# Patient Record
Sex: Female | Born: 1985 | Hispanic: Yes | Smoking: Never smoker
Health system: Southern US, Community
[De-identification: ages and names within clinical notes are randomized; demographics above are authoritative.]

## PROBLEM LIST (undated history)

## (undated) DIAGNOSIS — I10 Essential (primary) hypertension: Secondary | ICD-10-CM

## (undated) DIAGNOSIS — E119 Type 2 diabetes mellitus without complications: Secondary | ICD-10-CM

## (undated) HISTORY — PX: NO PAST SURGERIES: SHX2092

## (undated) HISTORY — DX: Essential (primary) hypertension: I10

---
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
## 2006-03-12 ENCOUNTER — Inpatient Hospital Stay (HOSPITAL_COMMUNITY): Admission: AD | Admit: 2006-03-12 | Discharge: 2006-03-12 | Payer: Self-pay | Admitting: Obstetrics and Gynecology

## 2006-03-15 ENCOUNTER — Inpatient Hospital Stay (HOSPITAL_COMMUNITY): Admission: AD | Admit: 2006-03-15 | Discharge: 2006-03-15 | Payer: Self-pay | Admitting: *Deleted

## 2006-04-01 ENCOUNTER — Inpatient Hospital Stay (HOSPITAL_COMMUNITY): Admission: AD | Admit: 2006-04-01 | Discharge: 2006-04-04 | Payer: Self-pay | Admitting: Obstetrics and Gynecology

## 2006-06-16 ENCOUNTER — Ambulatory Visit: Payer: Self-pay | Admitting: Obstetrics and Gynecology

## 2006-06-16 ENCOUNTER — Inpatient Hospital Stay (HOSPITAL_COMMUNITY): Admission: AD | Admit: 2006-06-16 | Discharge: 2006-06-20 | Payer: Self-pay | Admitting: Family Medicine

## 2006-06-27 ENCOUNTER — Inpatient Hospital Stay (HOSPITAL_COMMUNITY): Admission: AD | Admit: 2006-06-27 | Discharge: 2006-06-27 | Payer: Self-pay | Admitting: Obstetrics and Gynecology

## 2006-06-27 ENCOUNTER — Ambulatory Visit: Payer: Self-pay | Admitting: Family Medicine

## 2006-07-11 ENCOUNTER — Ambulatory Visit: Payer: Self-pay | Admitting: Gynecology

## 2006-07-25 ENCOUNTER — Ambulatory Visit: Payer: Self-pay | Admitting: Family Medicine

## 2006-08-08 ENCOUNTER — Ambulatory Visit: Payer: Self-pay | Admitting: Gynecology

## 2006-08-29 ENCOUNTER — Ambulatory Visit: Payer: Self-pay | Admitting: Gynecology

## 2006-09-03 ENCOUNTER — Ambulatory Visit: Payer: Self-pay | Admitting: Obstetrics and Gynecology

## 2006-09-05 ENCOUNTER — Ambulatory Visit: Payer: Self-pay | Admitting: Gynecology

## 2006-09-09 ENCOUNTER — Ambulatory Visit: Payer: Self-pay | Admitting: Obstetrics and Gynecology

## 2006-09-09 ENCOUNTER — Inpatient Hospital Stay (HOSPITAL_COMMUNITY): Admission: AD | Admit: 2006-09-09 | Discharge: 2006-09-11 | Payer: Self-pay | Admitting: Obstetrics & Gynecology

## 2006-09-09 ENCOUNTER — Ambulatory Visit: Payer: Self-pay | Admitting: Obstetrics & Gynecology

## 2007-03-10 ENCOUNTER — Emergency Department (HOSPITAL_COMMUNITY): Admission: EM | Admit: 2007-03-10 | Discharge: 2007-03-10 | Payer: Self-pay | Admitting: Emergency Medicine

## 2014-10-11 ENCOUNTER — Other Ambulatory Visit (HOSPITAL_COMMUNITY): Payer: Self-pay | Admitting: Nurse Practitioner

## 2014-10-11 DIAGNOSIS — Z3689 Encounter for other specified antenatal screening: Secondary | ICD-10-CM

## 2014-10-11 LAB — GLUCOSE TOLERANCE, 1 HOUR (50G) W/O FASTING: GLUCOSE 1 HOUR GTT: 142 mg/dL (ref ?–200)

## 2014-10-11 LAB — OB RESULTS CONSOLE ANTIBODY SCREEN: Antibody Screen: NEGATIVE

## 2014-10-11 LAB — OB RESULTS CONSOLE HGB/HCT, BLOOD
HCT: 38 %
Hemoglobin: 12.5 g/dL

## 2014-10-11 LAB — OB RESULTS CONSOLE ABO/RH: RH TYPE: NEGATIVE

## 2014-10-11 LAB — OB RESULTS CONSOLE PLATELET COUNT: Platelets: 227 10*3/uL

## 2014-10-11 LAB — OB RESULTS CONSOLE GC/CHLAMYDIA
Chlamydia: NEGATIVE
GC PROBE AMP, GENITAL: NEGATIVE

## 2014-10-11 LAB — OB RESULTS CONSOLE HIV ANTIBODY (ROUTINE TESTING): HIV: NONREACTIVE

## 2014-10-11 LAB — OB RESULTS CONSOLE VARICELLA ZOSTER ANTIBODY, IGG
Varicella: IMMUNE
Varicella: IMMUNE

## 2014-10-11 LAB — CYSTIC FIBROSIS DIAGNOSTIC STUDY: Interpretation-CFDNA:: NEGATIVE

## 2014-10-11 LAB — CYTOLOGY - PAP: CYTOLOGY - PAP: NEGATIVE

## 2014-10-11 LAB — OB RESULTS CONSOLE RPR: RPR: NONREACTIVE

## 2014-10-11 LAB — CULTURE, OB URINE: URINE CULTURE, OB: NO GROWTH

## 2014-10-12 LAB — OB RESULTS CONSOLE HEPATITIS B SURFACE ANTIGEN: Hepatitis B Surface Ag: NEGATIVE

## 2014-10-12 LAB — OB RESULTS CONSOLE HIV ANTIBODY (ROUTINE TESTING): HIV: NONREACTIVE

## 2014-10-12 LAB — OB RESULTS CONSOLE RUBELLA ANTIBODY, IGM: Rubella: IMMUNE

## 2014-10-12 LAB — SICKLE CELL SCREEN: SICKLE CELL SCREEN: NORMAL

## 2014-10-18 ENCOUNTER — Encounter: Payer: Self-pay | Attending: Obstetrics & Gynecology | Admitting: *Deleted

## 2014-10-18 ENCOUNTER — Ambulatory Visit: Payer: Self-pay | Admitting: Obstetrics & Gynecology

## 2014-10-18 VITALS — Ht 59.0 in | Wt 146.0 lb

## 2014-10-18 DIAGNOSIS — O24419 Gestational diabetes mellitus in pregnancy, unspecified control: Secondary | ICD-10-CM

## 2014-10-18 DIAGNOSIS — Z713 Dietary counseling and surveillance: Secondary | ICD-10-CM | POA: Insufficient documentation

## 2014-10-18 LAB — POCT URINALYSIS DIP (DEVICE)
Bilirubin Urine: NEGATIVE
Glucose, UA: NEGATIVE mg/dL
HGB URINE DIPSTICK: NEGATIVE
KETONES UR: NEGATIVE mg/dL
Nitrite: NEGATIVE
PH: 7 (ref 5.0–8.0)
Protein, ur: NEGATIVE mg/dL
SPECIFIC GRAVITY, URINE: 1.015 (ref 1.005–1.030)
UROBILINOGEN UA: 0.2 mg/dL (ref 0.0–1.0)

## 2014-10-18 NOTE — Progress Notes (Signed)
  Patient was seen on 10/18/14 for Gestational Diabetes self-management . The following learning objectives were met by the patient :   States the definition of Gestational Diabetes  States when to check blood glucose levels  Demonstrates proper blood glucose monitoring techniques  States the effect of stress and exercise on blood glucose levels  States the importance of limiting caffeine and abstaining from alcohol and smoking  Plan:  Consider  increasing your activity level by walking daily as tolerated Begin checking BG before breakfast and 1-2 hours after first bit of breakfast, lunch and dinner after  as directed by MD  Take medication  as directed by MD  Blood glucose monitor given: [employer] Lot # [id number] Exp: 2016/09/19 Blood glucose reading: 130m/dl 3hpp  Patient instructed to monitor glucose levels: FBS: 60 - <90 2 hour: <120  Patient received the following handouts:  Nutrition Diabetes and Pregnancy  Patient will be seen for follow-up as needed.

## 2014-10-18 NOTE — Progress Notes (Signed)
Nutrition note: GDM diet education only Pt is a newly diagnosed GDM pt Pt has gained 3# @ [id number] (pt due 04/03/15 per pt), which is < expected so far. Pt reports eating 3 meals & 2 snacks/d. Pt is taking a PNV. Pt reports having N/V often and heartburn occ. NKFA. Pt reports no physical activity currently. Pt received verbal & written education in Spanish via interpreter about GDM diet. Discussed tips to decrease N/V. Encouraged walking most days. Discussed wt gain goals of 15-25# or 0.6#/wk. Pt agrees to continue taking PNV & follow GDM diet with 3 meals & 2-3 snacks/d with proper CHO/ protein combination. Pt has WIC & plans to BF. F/u in 4-6 wks Blondell RevealLaura Garlene Apperson, MS, RD, LDN, [facility]

## 2014-10-19 DIAGNOSIS — O09299 Supervision of pregnancy with other poor reproductive or obstetric history, unspecified trimester: Secondary | ICD-10-CM | POA: Insufficient documentation

## 2014-10-19 DIAGNOSIS — O26899 Other specified pregnancy related conditions, unspecified trimester: Secondary | ICD-10-CM

## 2014-10-19 DIAGNOSIS — O161 Unspecified maternal hypertension, first trimester: Secondary | ICD-10-CM

## 2014-10-19 DIAGNOSIS — O24419 Gestational diabetes mellitus in pregnancy, unspecified control: Secondary | ICD-10-CM

## 2014-10-19 DIAGNOSIS — Z6741 Type O blood, Rh negative: Secondary | ICD-10-CM

## 2014-10-19 DIAGNOSIS — Z758 Other problems related to medical facilities and other health care: Secondary | ICD-10-CM

## 2014-10-19 DIAGNOSIS — Z789 Other specified health status: Secondary | ICD-10-CM

## 2014-10-19 DIAGNOSIS — Z6791 Unspecified blood type, Rh negative: Secondary | ICD-10-CM | POA: Insufficient documentation

## 2014-10-20 ENCOUNTER — Encounter: Payer: Self-pay | Admitting: *Deleted

## 2014-10-25 ENCOUNTER — Encounter: Payer: Self-pay | Admitting: Family Medicine

## 2014-10-25 ENCOUNTER — Ambulatory Visit (INDEPENDENT_AMBULATORY_CARE_PROVIDER_SITE_OTHER): Payer: Self-pay | Admitting: Family Medicine

## 2014-10-25 VITALS — BP 124/63 | HR 96 | Temp 98.5°F | Wt 145.2 lb

## 2014-10-25 DIAGNOSIS — Z8759 Personal history of other complications of pregnancy, childbirth and the puerperium: Secondary | ICD-10-CM

## 2014-10-25 DIAGNOSIS — O24419 Gestational diabetes mellitus in pregnancy, unspecified control: Secondary | ICD-10-CM

## 2014-10-25 DIAGNOSIS — O09292 Supervision of pregnancy with other poor reproductive or obstetric history, second trimester: Secondary | ICD-10-CM

## 2014-10-25 DIAGNOSIS — O0992 Supervision of high risk pregnancy, unspecified, second trimester: Secondary | ICD-10-CM

## 2014-10-25 DIAGNOSIS — O09219 Supervision of pregnancy with history of pre-term labor, unspecified trimester: Secondary | ICD-10-CM | POA: Insufficient documentation

## 2014-10-25 DIAGNOSIS — O0993 Supervision of high risk pregnancy, unspecified, third trimester: Secondary | ICD-10-CM | POA: Insufficient documentation

## 2014-10-25 LAB — POCT URINALYSIS DIP (DEVICE)
BILIRUBIN URINE: NEGATIVE
GLUCOSE, UA: NEGATIVE mg/dL
HGB URINE DIPSTICK: NEGATIVE
Ketones, ur: NEGATIVE mg/dL
Nitrite: NEGATIVE
Protein, ur: 30 mg/dL — AB
SPECIFIC GRAVITY, URINE: 1.02 (ref 1.005–1.030)
Urobilinogen, UA: 0.2 mg/dL (ref 0.0–1.0)
pH: 7 (ref 5.0–8.0)

## 2014-10-25 MED ORDER — GLYBURIDE 5 MG PO TABS: 5.0000 mg | ORAL_TABLET | Freq: Every day | ORAL | Status: DC

## 2014-10-25 NOTE — Progress Notes (Signed)
Patient transferred here from [facility] due to failing an early 3 hr GTT.  Met with nutrition and diabetic nurse last week and has been checking her blood sugars.  Fasting - 114-125 PP - 9/21 elevated Start glyburide 35m in PM Has history of gestational hypertension with other pregnancies, never on medications. Counseled regarding importance of checking blood sugars and reporting accurate numbers. F/u in 2 weeks.

## 2014-10-25 NOTE — Patient Instructions (Signed)
Diabetes mellitus gestacional (Gestational Diabetes Mellitus) La diabetes mellitus gestacional, ms comnmente conocida como diabetes gestacional es un tipo de diabetes que desarrollan algunas mujeres durante el embarazo. En la diabetes gestacional, el pncreas no produce suficiente insulina (una hormona) o las clulas son menos sensibles a la insulina producida (resistencia a la insulina), o ambas cosas. Normalmente, la insulina mueve los azcares de los alimentos a las clulas de los tejidos. Las clulas de los tejidos utilizan los azcares para obtener energa. La falta de insulina o la falta de una respuesta normal a la insulina hace que el exceso de azcar se acumule en la sangre en lugar de penetrar en las clulas de los tejidos. Como resultado, se producen niveles altos de azcar en la sangre (hiperglucemia). El efecto de los niveles altos de azcar (glucosa) puede causar muchos problemas.  FACTORES DE RIESGO Usted tiene mayor probabilidad de desarrollar diabetes gestacional si tiene antecedentes familiares de diabetes y tambin si tiene uno o ms de los siguientes factores de riesgo:  ndice de masa corporal superior a 30 (obesidad).  Embarazo previo con diabetes gestacional.  La edad avanzada en el momento del embarazo. Si se mantienen los niveles de glucosa en la sangre en un rango normal durante el embarazo, las mujeres pueden tener un embarazo saludable. Si los niveles de glucosa en la sangre no estn bien controlados, puede haber riesgos para usted, el feto o el recin nacido, o durante el trabajo de parto y el parto.  SNTOMAS  Si se presentan sntomas, stos son similares a los sntomas que normalmente experimentar durante el embarazo. Los sntomas de la diabetes gestacional son:   Aumento de la sed (polidipsia).  Aumento de la miccin (poliuria).  Orina con ms frecuencia durante la noche (nocturia).  Prdida de peso. La prdida de peso puede ser muy rpida.  Infecciones  frecuentes y recurrentes.  Cansancio (fatiga).  Debilidad.  Cambios en la visin, como visin borrosa.  Olor a fruta en el aliento.  Dolor abdominal. DIAGNSTICO La diabetes se diagnostica cuando hay aumento de los niveles de glucosa en la sangre. El nivel de glucosa en la sangre puede controlarse en uno o ms de los siguientes anlisis de sangre:  Medicin de glucosa en la sangre en ayunas. No se le permitir comer durante al menos 8 horas antes de que se tome una muestra de sangre.  Pruebas al azar de glucosa en la sangre. El nivel de glucosa en la sangre se controla en cualquier momento del da sin importar el momento en que haya comido.  Prueba de A1c (hemoglobina glucosilada) Una prueba de A1c proporciona informacin sobre el control de la glucosa en la sangre durante los ltimos 3 meses.  Prueba de tolerancia a la glucosa oral (PTGO). La glucosa en la sangre se mide despus de no haber comido (ayunas) durante una a tres horas y despus de beber una bebida que contenga glucosa. Dado que las hormonas que causan la resistencia a la insulina son ms altas alrededor de las semanas 24 a 28 de embarazo, generalmente se realiza una PTGO durante ese tiempo. Si tiene factores de riesgo de diabetes gestacional, su mdico puede hacerle estudios de deteccin antes de las 24semanas de embarazo. TRATAMIENTO   Usted tendr que tomar medicamentos para la diabetes o insulina diariamente para mantener los niveles de glucosa en la sangre en el rango deseado.  Usted tendr que combinar la dosis de insulina con la actividad fsica y la eleccin de alimentos saludables. El objetivo del   tratamiento es mantener el nivel de azcar en la sangre previo a comer (preprandial) y durante la noche entre 60 y 99mg/dl, durante todo el embarazo. El objetivo del tratamiento es mantener el nivel pico de azcar en la sangre despus de comer (glucosa posprandial) entre 100y 140mg/dl. INSTRUCCIONES PARA EL CUIDADO EN EL  HOGAR   Controle su nivel de hemoglobina A1c dos veces al ao.  Contrlese a diario el nivel de glucosa en la sangre segn las indicaciones de su mdico. Es comn realizar controles frecuentes de la glucosa en la sangre.  Supervise las cetonas en la orina cuando est enferma y segn las indicaciones de su mdico.  Tome el medicamento para la diabetes y adminstrese insulina segn las indicaciones de su mdico para mantener el nivel de glucosa en la sangre en el rango deseado.  Nunca se quede sin medicamento para la diabetes o sin insulina. Es necesario que la reciba todos los das.  Ajuste la insulina segn la ingesta de hidratos de carbono. Los hidratos de carbono pueden aumentar los niveles de glucosa en la sangre, pero deben incluirse en su dieta. Los hidratos de carbono aportan vitaminas, minerales y fibra que son una parte esencial de una dieta saludable. Los hidratos de carbono se encuentran en frutas, verduras, cereales integrales, productos lcteos, legumbres y alimentos que contienen azcares aadidos.  Consuma alimentos saludables. Alterne 3 comidas con 3 colaciones.  Aumente de peso saludablemente. El aumento del peso total vara de acuerdo con el ndice de masa corporal que tena antes del embarazo ([facility]).  Lleve una tarjeta de alerta mdica o use una pulsera o medalla de alerta mdica.  Lleve con usted una colacin de 15gramos de hidratos de carbono en todo momento para controlar los niveles bajos de glucosa en la sangre (hipoglucemia). Algunos ejemplos de colaciones de 15gramos de hidratos de carbono son los siguientes:  Tabletas de glucosa, 3 o 4.  Gel de glucosa, tubo de 15 gramos.  Pasas de uva, 2 cucharadas (24 g).  Caramelos de goma, 6.  Galletas de animales, 8.  Jugo de fruta, gaseosa comn, o leche descremada, 4 onzas (120 ml).  Pastillas de goma, 9.  Reconocer la hipoglucemia. Durante el embarazo la hipoglucemia se produce cuando hay niveles de glucosa en la  sangre de 60 mg/dl o menos. El riesgo de hipoglucemia aumenta durante el ayuno o cuando se saltea las comidas, durante o despus de realizar ejercicio intenso y mientras duerme. Los sntomas de hipoglucemia son:  Temblores o sacudidas.  Disminucin de la capacidad de concentracin.  Sudoracin.  Aumento de la frecuencia cardaca.  Dolor de cabeza.  Sequedad en la boca.  Hambre.  Irritabilidad.  Ansiedad.  Sueo agitado.  Alteracin del habla o de la coordinacin.  Confusin.  Tratar la hipoglucemia rpidamente. Si usted est alerta y puede tragar con seguridad, siga la regla de 15/15 que consiste en:  Tome entre 15 y 20gramos de glucosa de accin rpida o carbohidratos. Las opciones de accin rpida son un gel de glucosa, tabletas de glucosa, o 4 onzas (120 ml) de jugo de frutas, gaseosa comn, o leche baja en grasa.  Compruebe su nivel de glucosa en la sangre 15 minutos despus de tomar la glucosa.  Tome entre 15 y 20 gramos ms de glucosa si el nivel de glucosa en la sangre todava es de 70mg/dl o inferior.  Ingiera una comida o una colacin en el lapso de 1 hora una vez que los niveles de glucosa en la sangre vuelven   a la normalidad.  Est atento a la poliuria (miccin excesiva) y la polidipsia (sensacin de mucha sed), que son los primeros signos de la hiperglucemia. El reconocimiento temprano de la hiperglucemia permite un tratamiento oportuno. Trate la hiperglucemia segn le indic su mdico.  Haga actividad fsica por lo menos 30minutos al da o como lo indique su mdico. Se recomienda que 30 minutos despus de cada comida, realice diez minutos de actividad fsica para controlar los niveles de glucosa postprandial en la sangre.  Ajuste su dosis de insulina y la ingesta de alimentos, segn sea necesario, si inicia un nuevo ejercicio o deporte.  Siga su plan para los das de enfermedad cuando no pueda comer o beber como de costumbre.  Evite el tabaco y el  alcohol.  Concurra a todas las visitas de control como se lo haya indicado el mdico.  Siga el consejo del mdico respecto a los controles prenatales y posteriores al parto (postparto), las visitas, la planificacin de las comidas, el ejercicio, los medicamentos, las vitaminas, los anlisis de sangre, otras pruebas mdicas y actividades fsicas.  Realice diariamente el cuidado de la piel y de los pies. Examine su piel y los pies diariamente para ver si tiene cortes, moretones, enrojecimiento, problemas en las uas, sangrado, ampollas o llagas.  Cepllese los dientes y encas por lo menos dos veces al da y use hilo dental al menos una vez por da. Concurra regularmente a las visitas de control con el dentista.  Programe un examen de vista durante el primer trimestre de su embarazo o como lo indique su mdico.  Comparta su plan de control de diabetes en el trabajo o en la escuela.  Mantngase al da con las vacunas.  Aprenda a manejar el estrs.  Obtenga la mayor cantidad posible de informacin sobre la diabetes y solicite ayuda siempre que sea necesario.  Obtenga informacin sobre el amamantamiento y analice esta posibilidad.  Debe controlar el nivel de azcar en la sangre de 6a 12semanas despus del parto. Esto se hace con una prueba de tolerancia a la glucosa oral (PTGO). SOLICITE ATENCIN MDICA SI:   No puede comer alimentos o beber por ms de 6 horas.  Tuvo nuseas o ha vomitado durante ms de 6 horas.  Tiene un nivel de glucosa en la sangre de 200 mg/dl y cetonas en la orina.  Presenta algn cambio en el estado mental.  Desarrolla problemas de visin.  Sufre un dolor persistente de cabeza.  Siente dolor o molestias en la parte superior del abdomen.  Desarrolla una enfermedad grave adicional.  Tuvo diarrea durante ms de 6 horas.  Ha estado enfermo o ha tenido fiebre durante un par de das y no mejora. SOLICITE ATENCIN MDICA DE INMEDIATO SI:   Tiene dificultad  para respirar.  Ya no siente los movimientos del beb.  Est sangrando o tiene flujo vaginal.  Comienza a tener contracciones o trabajo de parto prematuro. ASEGRESE DE QUE:  Comprende estas instrucciones.  Controlar su afeccin.  Recibir ayuda de inmediato si no mejora o si empeora. Document Released: 09/26/2005 Document Revised: 05/03/2014 [employer] Patient Information 2015 [employer]. This information is not intended to replace advice given to you by your health care provider. Make sure you discuss any questions you have with your health care provider.  

## 2014-11-01 ENCOUNTER — Encounter: Payer: Self-pay | Admitting: Family Medicine

## 2014-11-08 ENCOUNTER — Encounter: Payer: Self-pay | Admitting: Obstetrics & Gynecology

## 2014-11-08 ENCOUNTER — Ambulatory Visit (INDEPENDENT_AMBULATORY_CARE_PROVIDER_SITE_OTHER): Payer: Self-pay | Admitting: Obstetrics & Gynecology

## 2014-11-08 ENCOUNTER — Encounter: Payer: Self-pay | Attending: Obstetrics & Gynecology | Admitting: *Deleted

## 2014-11-08 VITALS — BP 103/54 | HR 98 | Temp 98.5°F | Wt 144.9 lb

## 2014-11-08 DIAGNOSIS — O0992 Supervision of high risk pregnancy, unspecified, second trimester: Secondary | ICD-10-CM

## 2014-11-08 DIAGNOSIS — Z713 Dietary counseling and surveillance: Secondary | ICD-10-CM | POA: Insufficient documentation

## 2014-11-08 DIAGNOSIS — R8290 Unspecified abnormal findings in urine: Secondary | ICD-10-CM

## 2014-11-08 DIAGNOSIS — O161 Unspecified maternal hypertension, first trimester: Secondary | ICD-10-CM

## 2014-11-08 DIAGNOSIS — O24419 Gestational diabetes mellitus in pregnancy, unspecified control: Secondary | ICD-10-CM | POA: Insufficient documentation

## 2014-11-08 DIAGNOSIS — O09292 Supervision of pregnancy with other poor reproductive or obstetric history, second trimester: Secondary | ICD-10-CM

## 2014-11-08 DIAGNOSIS — O131 Gestational [pregnancy-induced] hypertension without significant proteinuria, first trimester: Secondary | ICD-10-CM

## 2014-11-08 LAB — POCT URINALYSIS DIP (DEVICE)
GLUCOSE, UA: NEGATIVE mg/dL
HGB URINE DIPSTICK: NEGATIVE
Ketones, ur: NEGATIVE mg/dL
NITRITE: NEGATIVE
Protein, ur: 30 mg/dL — AB
SPECIFIC GRAVITY, URINE: 1.025 (ref 1.005–1.030)
Urobilinogen, UA: 0.2 mg/dL (ref 0.0–1.0)
pH: 6 (ref 5.0–8.0)

## 2014-11-08 LAB — CBC
HEMATOCRIT: 35.7 % — AB (ref 36.0–46.0)
Hemoglobin: 12.2 g/dL (ref 12.0–15.0)
MCH: 29 pg (ref 26.0–34.0)
MCHC: 34.2 g/dL (ref 30.0–36.0)
MCV: 84.8 fL (ref 78.0–100.0)
Platelets: 238 10*3/uL (ref 150–400)
RBC: 4.21 MIL/uL (ref 3.87–5.11)
RDW: 13.9 % (ref 11.5–15.5)
WBC: 11.4 10*3/uL — AB (ref 4.0–10.5)

## 2014-11-08 MED ORDER — ASPIRIN EC 81 MG PO TBEC: 81.0000 mg | DELAYED_RELEASE_TABLET | Freq: Every day | ORAL | Status: DC

## 2014-11-08 NOTE — Progress Notes (Signed)
DIABETIC: Review of dietary eating schedule. Always have protein with carbs. Always have HS snack to include carbs & Protein. FBS readings 89-108 as she is getting up during the night to eat. Patient verbalized understanding through assistance of Spanish interpreter.

## 2014-11-08 NOTE — Progress Notes (Signed)
Fetal Echo with Dr. Elizebeth Brookingotton 12/07/14 @ 830a; pt aware $100 due at time of appt.  [facility] [phone]), pt aware $75 due at time of appt.Spanish interpreter Maretta LosBlanca Lindner present.

## 2014-11-08 NOTE — Patient Instructions (Signed)
Regrese a la clinica cuando tenga su cita. Si tiene problemas o preguntas, llama a la clinica o vaya a la sala de emergencia al [facility].    

## 2014-11-08 NOTE — Progress Notes (Signed)
Patient is Spanish-speaking only, Spanish interpreter present for this encounter. Patient was started on Glyburide 5 mg po qhs last visit. Since then, she reports multiple episodes of hypoglycemia in the 50s, has to eat a couple of hours before she checks fasting values. As such, fastings 79-108 (seven values above 100).  Postprandials are mostly within range, abnormal lunch values of 139, 140 and dinner 153, 185, 160. Will reduce dosage to Glyburide 2.5 mg po qhs.  Instructed on how to eat meals and snacks, emphasized importance of bedtime snack. Patient to meet with DM educator today.  WIll reevaluate next week.  If still very hypoglycemic, may consider another medication such as Metformin 500 mg po bid and titrate up as needed.   Urine dip showed 30 protein, moderate LE, culture sent. No urinary symptoms. Anatomy scan scheduled for next week.  Quad screen to be checked today in addition to baseline A2/B DM labs.  Fetal ECHO and Optho exam ordered.  ASA 81 mg po daily ordered, also given history of preeclampsia in previous pregnancies. No other complaints or concerns.  Preeclampsia, routine obstetric precautions reviewed.

## 2014-11-09 LAB — AFP, QUAD SCREEN
AFP: 63.8 ng/mL
Curr Gest Age: 19.1 wks.days
Down Syndrome Scr Risk Est: 1:38500 {titer}
HCG TOTAL: 29.82 [IU]/mL
INH: 113.6 pg/mL
INTERPRETATION-AFP: NEGATIVE
MoM for AFP: 1.21
MoM for INH: 0.62
MoM for hCG: 1.21
OPEN SPINA BIFIDA: NEGATIVE
Tri 18 Scr Risk Est: NEGATIVE
Trisomy 18 (Edward) Syndrome Interp.: <1:77400 {titer}
UE3 VALUE: 3.19 ng/mL
uE3 Mom: 2.06

## 2014-11-09 LAB — HEMOGLOBIN A1C
HEMOGLOBIN A1C: 5.1 % (ref <5.7)
Mean Plasma Glucose: 100 mg/dL (ref <117)

## 2014-11-09 LAB — COMPREHENSIVE METABOLIC PANEL
ALT: 10 U/L (ref 0–35)
AST: 19 U/L (ref 0–37)
Albumin: 3.8 g/dL (ref 3.5–5.2)
Alkaline Phosphatase: 55 U/L (ref 39–117)
BILIRUBIN TOTAL: 0.3 mg/dL (ref 0.2–1.2)
BUN: 7 mg/dL (ref 6–23)
CHLORIDE: 103 meq/L (ref 96–112)
CO2: 24 meq/L (ref 19–32)
CREATININE: 0.43 mg/dL — AB (ref 0.50–1.10)
Calcium: 9.3 mg/dL (ref 8.4–10.5)
GLUCOSE: 80 mg/dL (ref 70–99)
Potassium: 4 mEq/L (ref 3.5–5.3)
Sodium: 135 mEq/L (ref 135–145)
TOTAL PROTEIN: 6.5 g/dL (ref 6.0–8.3)

## 2014-11-09 LAB — PROTEIN / CREATININE RATIO, URINE
Creatinine, Urine: 244.1 mg/dL
Protein Creatinine Ratio: 0.12 (ref <0.15)
TOTAL PROTEIN, URINE: 30 mg/dL — AB (ref 5–24)

## 2014-11-09 LAB — TSH: TSH: 2.026 u[IU]/mL (ref 0.350–4.500)

## 2014-11-10 LAB — CULTURE, OB URINE

## 2014-11-15 ENCOUNTER — Ambulatory Visit (HOSPITAL_COMMUNITY)
Admission: RE | Admit: 2014-11-15 | Discharge: 2014-11-15 | Disposition: A | Discharge status: Home / Self Care | Payer: Self-pay | Source: Ambulatory Visit | Attending: Nurse Practitioner | Admitting: Nurse Practitioner

## 2014-11-15 ENCOUNTER — Ambulatory Visit (INDEPENDENT_AMBULATORY_CARE_PROVIDER_SITE_OTHER): Payer: Self-pay | Admitting: Family Medicine

## 2014-11-15 VITALS — BP 117/64 | HR 94 | Temp 98.5°F | Wt 144.8 lb

## 2014-11-15 DIAGNOSIS — Z36 Encounter for antenatal screening of mother: Secondary | ICD-10-CM | POA: Insufficient documentation

## 2014-11-15 DIAGNOSIS — O24112 Pre-existing diabetes mellitus, type 2, in pregnancy, second trimester: Secondary | ICD-10-CM

## 2014-11-15 DIAGNOSIS — Z3689 Encounter for other specified antenatal screening: Secondary | ICD-10-CM | POA: Insufficient documentation

## 2014-11-15 DIAGNOSIS — Z3A20 20 weeks gestation of pregnancy: Secondary | ICD-10-CM | POA: Insufficient documentation

## 2014-11-15 LAB — POCT URINALYSIS DIP (DEVICE)
Bilirubin Urine: NEGATIVE
Glucose, UA: NEGATIVE mg/dL
Hgb urine dipstick: NEGATIVE
Ketones, ur: NEGATIVE mg/dL
NITRITE: NEGATIVE
PROTEIN: NEGATIVE mg/dL
SPECIFIC GRAVITY, URINE: 1.025 (ref 1.005–1.030)
UROBILINOGEN UA: 0.2 mg/dL (ref 0.0–1.0)
pH: 6.5 (ref 5.0–8.0)

## 2014-11-15 NOTE — Progress Notes (Signed)
Pt states compliance with glyburide 2.5 mg PO qhs. Dec from 5 to 2.5 last visit because she felt bad and shaky Fasting - 108, 111, 106, 107, 99, 93, 98 (6 of 7 above 95) 2 hr after - 90 - 120 (all controlled)  Given Am above goal, increased to 3.75 mg q PM.  No other complaints. Anatomy scan today

## 2014-11-15 NOTE — Progress Notes (Signed)
Patient reports occasional pelvic pressure; carol present for interprete

## 2014-11-29 ENCOUNTER — Ambulatory Visit (INDEPENDENT_AMBULATORY_CARE_PROVIDER_SITE_OTHER): Payer: Self-pay | Admitting: Family Medicine

## 2014-11-29 VITALS — BP 109/62 | HR 96 | Temp 98.0°F | Wt 145.0 lb

## 2014-11-29 DIAGNOSIS — O24419 Gestational diabetes mellitus in pregnancy, unspecified control: Secondary | ICD-10-CM

## 2014-11-29 NOTE — Progress Notes (Signed)
Patient reports pelvic pressure.

## 2014-11-29 NOTE — Progress Notes (Signed)
Growth U/S with Radiology 12/13/14 @ 4p.  Spanish interpreter Pearletha AlfredMaria Elena Jimenez present.

## 2014-11-29 NOTE — Progress Notes (Signed)
Fasting - 3 out of 7 within range.  2hr PP - 3 out of range.  Will leave Glyburide the same as had hypoglycemia when increased to full tablet.  If other blood sugars increase, can add metformin or in glyburide. No contractions, good fetal movement. Needs 24 hour urine.  Had optho appt.  Has appt for fetal echo.  Schedule f/u US for growth in 2 weeks.

## 2014-11-29 NOTE — Patient Instructions (Signed)
Segundo trimestre de embarazo (Second Trimester of Pregnancy) El segundo trimestre va desde la semana13 hasta la 28, desde el cuarto hasta el sexto mes, y suele ser el momento en el que mejor se siente. Su organismo se ha adaptado a estar embarazada y comienza a sentirse fsicamente mejor. En general, las nuseas matutinas han disminuido o han desaparecido completamente, p El segundo trimestre es tambin la poca en la que el feto se desarrolla rpidamente. Hacia el final del sexto mes, el feto mide aproximadamente 9pulgadas (23cm) y pesa alrededor de 1 libras (700g). Es probable que sienta que el beb se mueve (da pataditas) entre las 18 y 20semanas del embarazo. CAMBIOS EN EL ORGANISMO Su organismo atraviesa por muchos cambios durante el embarazo, y estos varan de una mujer a otra.   Seguir aumentando de peso. Notar que la parte baja del abdomen sobresale.  Podrn aparecer las primeras estras en las caderas, el abdomen y las mamas.  Es posible que tenga dolores de cabeza que pueden aliviarse con los medicamentos que su mdico autorice.  Tal vez tenga necesidad de orinar con ms frecuencia porque el feto est ejerciendo presin sobre la vejiga.  Debido al embarazo podr sentir acidez estomacal con frecuencia.  Puede estar estreida, ya que ciertas hormonas enlentecen los movimientos de los msculos que empujan los desechos a travs de los intestinos.  Pueden aparecer hemorroides o abultarse e hincharse las venas (venas varicosas).  Puede tener dolor de espalda que se debe al aumento de peso y a que las hormonas del embarazo relajan las articulaciones entre los huesos de la pelvis, y como consecuencia de la modificacin del peso y los msculos que mantienen el equilibrio.  Las mamas seguirn creciendo y le dolern.  Las encas pueden sangrar y estar sensibles al cepillado y al hilo dental.  Pueden aparecer zonas oscuras o manchas (cloasma, mscara del embarazo) en el rostro que  probablemente se atenuarn despus del nacimiento del beb.  Es posible que se forme una lnea oscura desde el ombligo hasta la zona del pubis (linea nigra) que probablemente se atenuarn despus del nacimiento del beb.  Tal vez haya cambios en el cabello que pueden incluir su engrosamiento, crecimiento rpido y cambios en la textura. Adems, a algunas mujeres se les cae el cabello durante o despus del embarazo, o tienen el cabello seco o fino. Lo ms probable es que el cabello se le normalice despus del nacimiento del beb. QU DEBE ESPERAR EN LAS CONSULTAS PRENATALES Durante una visita prenatal de rutina:  La pesarn para asegurarse de que usted y el feto estn creciendo normalmente.  Le tomarn la presin arterial.  Le medirn el abdomen para controlar el desarrollo del beb.  Se escucharn los latidos cardacos fetales.  Se evaluarn los resultados de los estudios solicitados en visitas anteriores. El mdico puede preguntarle lo siguiente:  Cmo se siente.  Si siente los movimientos del beb.  Si ha tenido sntomas anormales, como prdida de lquido, sangrado, dolores de cabeza intensos o clicos abdominales.  Si tiene alguna pregunta. Otros estudios que podrn realizarse durante el segundo trimestre incluyen lo siguiente:  Anlisis de sangre para detectar:  Concentraciones de hierro bajas (anemia).  Diabetes gestacional (entre la semana 24 y la 28).  Anticuerpos Rh.  Anlisis de orina para detectar infecciones, diabetes o protenas en la orina.  Una ecografa para confirmar que el beb crece y se desarrolla correctamente.  Una amniocentesis para diagnosticar posibles problemas genticos.  Estudios del feto para descartar espina   bfida y sndrome de Down. INSTRUCCIONES PARA EL CUIDADO EN EL HOGAR   Evite fumar, consumir hierbas, beber alcohol y tomar frmacos que no le hayan recetado. Estas sustancias qumicas afectan la formacin y el desarrollo del beb.  Siga  las indicaciones del mdico en relacin con el uso de medicamentos. Durante el embarazo, hay medicamentos que son seguros de tomar y otros que no.  Haga actividad fsica solo en la forma indicada por el mdico. Sentir clicos uterinos es un buen signo para detener la actividad fsica.  Contine comiendo alimentos que sanos con regularidad.  Use un sostn que le brinde buen soporte si le duelen las mamas.  No se d baos de inmersin en agua caliente, baos turcos ni saunas.  Colquese el cinturn de seguridad cuando conduzca.  No coma carne cruda ni queso sin cocinar; evite el contacto con las bandejas sanitarias de los gatos y la tierra que estos animales usan. Estos elementos contienen grmenes que pueden causar defectos congnitos en el beb.  Tome las vitaminas prenatales.  Si est estreida, pruebe un laxante suave (si el mdico lo autoriza). Consuma ms alimentos ricos en fibra, como vegetales y frutas frescos y cereales integrales. Beba gran cantidad de lquido para mantener la orina de tono claro o color amarillo plido.  Dese baos de asiento con agua tibia para aliviar el dolor o las molestias causadas por las hemorroides. Use una crema para las hemorroides si el mdico la autoriza.  Si tiene venas varicosas, use medias de descanso. Eleve los pies durante 15minutos, 3 o 4veces por da. Limite la cantidad de sal en su dieta.  No levante objetos pesados, use zapatos de tacones bajos y mantenga una buena postura.  Descanse con las piernas elevadas si tiene calambres o dolor de cintura.  Visite a su dentista si an no lo ha hecho durante el embarazo. Use un cepillo de dientes blando para higienizarse los dientes y psese el hilo dental con suavidad.  Puede seguir manteniendo relaciones sexuales, a menos que el mdico le indique lo contrario.  Concurra a todas las visitas prenatales segn las indicaciones de su mdico. SOLICITE ATENCIN MDICA SI:   Tiene mareos.  Siente  clicos leves, presin en la pelvis o dolor persistente en el abdomen.  Tiene nuseas, vmitos o diarrea persistentes.  Tiene secrecin vaginal con mal olor.  Siente dolor al orinar. SOLICITE ATENCIN MDICA DE INMEDIATO SI:   Tiene fiebre.  Tiene una prdida de lquido por la vagina.  Tiene sangrado o pequeas prdidas vaginales.  Siente dolor intenso o clicos en el abdomen.  Sube o baja de peso rpidamente.  Tiene dificultad para respirar y siente dolor de pecho.  Sbitamente se le hinchan mucho el rostro, las manos, los tobillos, los pies o las piernas.  No ha sentido los movimientos del beb durante una hora.  Siente un dolor de cabeza intenso que no se alivia con medicamentos.  Hay cambios en la visin. Document Released: 09/26/2005 Document Revised: 12/22/2013 [employer] Patient Information 2015 [employer]. This information is not intended to replace advice given to you by your health care provider. Make sure you discuss any questions you have with your health care provider.  

## 2014-12-07 ENCOUNTER — Other Ambulatory Visit: Payer: Self-pay

## 2014-12-07 DIAGNOSIS — O09213 Supervision of pregnancy with history of pre-term labor, third trimester: Secondary | ICD-10-CM

## 2014-12-08 LAB — COMPREHENSIVE METABOLIC PANEL
ALBUMIN: 3.5 g/dL (ref 3.5–5.2)
ALT: <8 U/L (ref 0–35)
AST: 12 U/L (ref 0–37)
Alkaline Phosphatase: 62 U/L (ref 39–117)
BUN: 5 mg/dL — AB (ref 6–23)
CALCIUM: 8.9 mg/dL (ref 8.4–10.5)
CHLORIDE: 102 meq/L (ref 96–112)
CO2: 23 mEq/L (ref 19–32)
CREATININE: 0.4 mg/dL — AB (ref 0.50–1.10)
Glucose, Bld: 109 mg/dL — ABNORMAL HIGH (ref 70–99)
Potassium: 3.6 mEq/L (ref 3.5–5.3)
Sodium: 138 mEq/L (ref 135–145)
Total Bilirubin: 0.3 mg/dL (ref 0.2–1.2)
Total Protein: 6.1 g/dL (ref 6.0–8.3)

## 2014-12-08 LAB — PROTEIN, URINE, 24 HOUR
PROTEIN, URINE: 7 mg/dL (ref 5–24)
Protein, 24H Urine: 124 mg/d (ref <150)

## 2014-12-13 ENCOUNTER — Ambulatory Visit (HOSPITAL_COMMUNITY)
Admission: RE | Admit: 2014-12-13 | Discharge: 2014-12-13 | Disposition: A | Discharge status: Home / Self Care | Payer: Self-pay | Source: Ambulatory Visit | Attending: Family Medicine | Admitting: Family Medicine

## 2014-12-13 ENCOUNTER — Ambulatory Visit (INDEPENDENT_AMBULATORY_CARE_PROVIDER_SITE_OTHER): Payer: Self-pay | Admitting: Obstetrics and Gynecology

## 2014-12-13 VITALS — BP 116/56 | HR 97 | Temp 98.2°F | Wt 146.4 lb

## 2014-12-13 DIAGNOSIS — O24410 Gestational diabetes mellitus in pregnancy, diet controlled: Secondary | ICD-10-CM | POA: Insufficient documentation

## 2014-12-13 DIAGNOSIS — O24419 Gestational diabetes mellitus in pregnancy, unspecified control: Secondary | ICD-10-CM

## 2014-12-13 DIAGNOSIS — O09292 Supervision of pregnancy with other poor reproductive or obstetric history, second trimester: Secondary | ICD-10-CM

## 2014-12-13 DIAGNOSIS — O0992 Supervision of high risk pregnancy, unspecified, second trimester: Secondary | ICD-10-CM

## 2014-12-13 DIAGNOSIS — Z3A00 Weeks of gestation of pregnancy not specified: Secondary | ICD-10-CM | POA: Insufficient documentation

## 2014-12-13 LAB — POCT URINALYSIS DIP (DEVICE)
Bilirubin Urine: NEGATIVE
GLUCOSE, UA: NEGATIVE mg/dL
Hgb urine dipstick: NEGATIVE
KETONES UR: NEGATIVE mg/dL
Nitrite: NEGATIVE
Protein, ur: NEGATIVE mg/dL
SPECIFIC GRAVITY, URINE: 1.015 (ref 1.005–1.030)
Urobilinogen, UA: 0.2 mg/dL (ref 0.0–1.0)
pH: 6.5 (ref 5.0–8.0)

## 2014-12-13 NOTE — Progress Notes (Signed)
Doing well today. No concerns or complaints.  1. A2GDM. Continues to take glyburide 5 mg qhs and blood sugars well controlled. Needs supplies today - will see diabetes educator. US for growth scheduled for later today.  2. History of PreE in prior pregnancy. Blood pressure at goal this pregnancy. Baseline 24 hour urine completed, 124. Taking aspirin 81 mg daily.  3. Routine PNC. Labs reviewed. Up to date. FM/PTL precautions. RTC in 2 weeks.

## 2014-12-14 DIAGNOSIS — IMO0002 Reserved for concepts with insufficient information to code with codable children: Secondary | ICD-10-CM | POA: Insufficient documentation

## 2014-12-14 DIAGNOSIS — Z3A24 24 weeks gestation of pregnancy: Secondary | ICD-10-CM | POA: Insufficient documentation

## 2014-12-14 DIAGNOSIS — Z0489 Encounter for examination and observation for other specified reasons: Secondary | ICD-10-CM | POA: Insufficient documentation

## 2014-12-27 ENCOUNTER — Encounter: Payer: Self-pay | Admitting: Family Medicine

## 2014-12-27 ENCOUNTER — Ambulatory Visit (INDEPENDENT_AMBULATORY_CARE_PROVIDER_SITE_OTHER): Payer: Self-pay | Admitting: Family Medicine

## 2014-12-27 VITALS — BP 112/50 | HR 93 | Temp 98.6°F | Wt 146.9 lb

## 2014-12-27 DIAGNOSIS — O24419 Gestational diabetes mellitus in pregnancy, unspecified control: Secondary | ICD-10-CM

## 2014-12-27 LAB — POCT URINALYSIS DIP (DEVICE)
Bilirubin Urine: NEGATIVE
GLUCOSE, UA: NEGATIVE mg/dL
Hgb urine dipstick: NEGATIVE
Ketones, ur: NEGATIVE mg/dL
Nitrite: NEGATIVE
PH: 6.5 (ref 5.0–8.0)
PROTEIN: NEGATIVE mg/dL
Specific Gravity, Urine: 1.015 (ref 1.005–1.030)
Urobilinogen, UA: 0.2 mg/dL (ref 0.0–1.0)

## 2014-12-27 NOTE — Progress Notes (Signed)
FBS 70-97 (2 of 14 out of range) 2 hr pp 76-155 ( 6 of 28 out of range) Will need Rhogam and TDaP with 28 wk labs next visit U/s for growth in 2 wks. Spanish interpreter: Okey RegalCarol used

## 2014-12-27 NOTE — Patient Instructions (Signed)
Diabetes mellitus gestacional (Gestational Diabetes Mellitus) La diabetes mellitus gestacional, ms comnmente conocida como diabetes gestacional es un tipo de diabetes que desarrollan algunas mujeres durante el embarazo. En la diabetes gestacional, el pncreas no produce suficiente insulina (una hormona) o las clulas son menos sensibles a la insulina producida (resistencia a la insulina), o ambas cosas. Normalmente, la insulina mueve los azcares de los alimentos a las clulas de los tejidos. Las clulas de los tejidos utilizan los azcares para obtener energa. La falta de insulina o la falta de una respuesta normal a la insulina hace que el exceso de azcar se acumule en la sangre en lugar de penetrar en las clulas de los tejidos. Como resultado, se producen niveles altos de azcar en la sangre (hiperglucemia). El efecto de los niveles altos de azcar (glucosa) puede causar muchos problemas.  FACTORES DE RIESGO Usted tiene mayor probabilidad de desarrollar diabetes gestacional si tiene antecedentes familiares de diabetes y tambin si tiene uno o ms de los siguientes factores de riesgo:  ndice de masa corporal superior a 30 (obesidad).  Embarazo previo con diabetes gestacional.  La edad avanzada en el momento del embarazo. Si se mantienen los niveles de glucosa en la sangre en un rango normal durante el embarazo, las mujeres pueden tener un embarazo saludable. Si los niveles de glucosa en la sangre no estn bien controlados, puede haber riesgos para usted, el feto o el recin nacido, o durante el trabajo de parto y el parto.  SNTOMAS  Si se presentan sntomas, stos son similares a los sntomas que normalmente experimentar durante el embarazo. Los sntomas de la diabetes gestacional son:   Aumento de la sed (polidipsia).  Aumento de la miccin (poliuria).  Orina con ms frecuencia durante la noche (nocturia).  Prdida de peso. La prdida de peso puede ser muy rpida.  Infecciones  frecuentes y recurrentes.  Cansancio (fatiga).  Debilidad.  Cambios en la visin, como visin borrosa.  Olor a fruta en el aliento.  Dolor abdominal. DIAGNSTICO La diabetes se diagnostica cuando hay aumento de los niveles de glucosa en la sangre. El nivel de glucosa en la sangre puede controlarse en uno o ms de los siguientes anlisis de sangre:  Medicin de glucosa en la sangre en ayunas. No se le permitir comer durante al menos 8 horas antes de que se tome una muestra de sangre.  Pruebas al azar de glucosa en la sangre. El nivel de glucosa en la sangre se controla en cualquier momento del da sin importar el momento en que haya comido.  Prueba de A1c (hemoglobina glucosilada) Una prueba de A1c proporciona informacin sobre el control de la glucosa en la sangre durante los ltimos 3 meses.  Prueba de tolerancia a la glucosa oral (PTGO). La glucosa en la sangre se mide despus de no haber comido (ayunas) durante una a tres horas y despus de beber una bebida que contenga glucosa. Dado que las hormonas que causan la resistencia a la insulina son ms altas alrededor de las semanas 24 a 28 de embarazo, generalmente se realiza una PTGO durante ese tiempo. Si tiene factores de riesgo de diabetes gestacional, su mdico puede hacerle estudios de deteccin antes de las 24semanas de embarazo. TRATAMIENTO   Usted tendr que tomar medicamentos para la diabetes o insulina diariamente para mantener los niveles de glucosa en la sangre en el rango deseado.  Usted tendr que combinar la dosis de insulina con la actividad fsica y la eleccin de alimentos saludables. El objetivo del   tratamiento es mantener el nivel de azcar en la sangre previo a comer (preprandial) y durante la noche entre 60 y 99mg/dl, durante todo el embarazo. El objetivo del tratamiento es mantener el nivel pico de azcar en la sangre despus de comer (glucosa posprandial) entre 100y 140mg/dl. INSTRUCCIONES PARA EL CUIDADO EN EL  HOGAR   Controle su nivel de hemoglobina A1c dos veces al ao.  Contrlese a diario el nivel de glucosa en la sangre segn las indicaciones de su mdico. Es comn realizar controles frecuentes de la glucosa en la sangre.  Supervise las cetonas en la orina cuando est enferma y segn las indicaciones de su mdico.  Tome el medicamento para la diabetes y adminstrese insulina segn las indicaciones de su mdico para mantener el nivel de glucosa en la sangre en el rango deseado.  Nunca se quede sin medicamento para la diabetes o sin insulina. Es necesario que la reciba todos los das.  Ajuste la insulina segn la ingesta de hidratos de carbono. Los hidratos de carbono pueden aumentar los niveles de glucosa en la sangre, pero deben incluirse en su dieta. Los hidratos de carbono aportan vitaminas, minerales y fibra que son una parte esencial de una dieta saludable. Los hidratos de carbono se encuentran en frutas, verduras, cereales integrales, productos lcteos, legumbres y alimentos que contienen azcares aadidos.  Consuma alimentos saludables. Alterne 3 comidas con 3 colaciones.  Aumente de peso saludablemente. El aumento del peso total vara de acuerdo con el ndice de masa corporal que tena antes del embarazo ([facility]).  Lleve una tarjeta de alerta mdica o use una pulsera o medalla de alerta mdica.  Lleve con usted una colacin de 15gramos de hidratos de carbono en todo momento para controlar los niveles bajos de glucosa en la sangre (hipoglucemia). Algunos ejemplos de colaciones de 15gramos de hidratos de carbono son los siguientes:  Tabletas de glucosa, 3 o 4.  Gel de glucosa, tubo de 15 gramos.  Pasas de uva, 2 cucharadas (24 g).  Caramelos de goma, 6.  Galletas de animales, 8.  Jugo de fruta, gaseosa comn, o leche descremada, 4 onzas (120 ml).  Pastillas de goma, 9.  Reconocer la hipoglucemia. Durante el embarazo la hipoglucemia se produce cuando hay niveles de glucosa en la  sangre de 60 mg/dl o menos. El riesgo de hipoglucemia aumenta durante el ayuno o cuando se saltea las comidas, durante o despus de realizar ejercicio intenso y mientras duerme. Los sntomas de hipoglucemia son:  Temblores o sacudidas.  Disminucin de la capacidad de concentracin.  Sudoracin.  Aumento de la frecuencia cardaca.  Dolor de cabeza.  Sequedad en la boca.  Hambre.  Irritabilidad.  Ansiedad.  Sueo agitado.  Alteracin del habla o de la coordinacin.  Confusin.  Tratar la hipoglucemia rpidamente. Si usted est alerta y puede tragar con seguridad, siga la regla de 15/15 que consiste en:  Tome entre 15 y 20gramos de glucosa de accin rpida o carbohidratos. Las opciones de accin rpida son un gel de glucosa, tabletas de glucosa, o 4 onzas (120 ml) de jugo de frutas, gaseosa comn, o leche baja en grasa.  Compruebe su nivel de glucosa en la sangre 15 minutos despus de tomar la glucosa.  Tome entre 15 y 20 gramos ms de glucosa si el nivel de glucosa en la sangre todava es de 70mg/dl o inferior.  Ingiera una comida o una colacin en el lapso de 1 hora una vez que los niveles de glucosa en la sangre vuelven   a la normalidad.  Est atento a la poliuria (miccin excesiva) y la polidipsia (sensacin de mucha sed), que son los primeros signos de la hiperglucemia. El reconocimiento temprano de la hiperglucemia permite un tratamiento oportuno. Trate la hiperglucemia segn le indic su mdico.  Haga actividad fsica por lo menos 30minutos al da o como lo indique su mdico. Se recomienda que 30 minutos despus de cada comida, realice diez minutos de actividad fsica para controlar los niveles de glucosa postprandial en la sangre.  Ajuste su dosis de insulina y la ingesta de alimentos, segn sea necesario, si inicia un nuevo ejercicio o deporte.  Siga su plan para los das de enfermedad cuando no pueda comer o beber como de costumbre.  Evite el tabaco y el  alcohol.  Concurra a todas las visitas de control como se lo haya indicado el mdico.  Siga el consejo del mdico respecto a los controles prenatales y posteriores al parto (postparto), las visitas, la planificacin de las comidas, el ejercicio, los medicamentos, las vitaminas, los anlisis de sangre, otras pruebas mdicas y actividades fsicas.  Realice diariamente el cuidado de la piel y de los pies. Examine su piel y los pies diariamente para ver si tiene cortes, moretones, enrojecimiento, problemas en las uas, sangrado, ampollas o llagas.  Cepllese los dientes y encas por lo menos dos veces al da y use hilo dental al menos una vez por da. Concurra regularmente a las visitas de control con el dentista.  Programe un examen de vista durante el primer trimestre de su embarazo o como lo indique su mdico.  Comparta su plan de control de diabetes en el trabajo o en la escuela.  Mantngase al da con las vacunas.  Aprenda a manejar el estrs.  Obtenga la mayor cantidad posible de informacin sobre la diabetes y solicite ayuda siempre que sea necesario.  Obtenga informacin sobre el amamantamiento y analice esta posibilidad.  Debe controlar el nivel de azcar en la sangre de 6a 12semanas despus del parto. Esto se hace con una prueba de tolerancia a la glucosa oral (PTGO). SOLICITE ATENCIN MDICA SI:   No puede comer alimentos o beber por ms de 6 horas.  Tuvo nuseas o ha vomitado durante ms de 6 horas.  Tiene un nivel de glucosa en la sangre de 200 mg/dl y cetonas en la orina.  Presenta algn cambio en el estado mental.  Desarrolla problemas de visin.  Sufre un dolor persistente de cabeza.  Siente dolor o molestias en la parte superior del abdomen.  Desarrolla una enfermedad grave adicional.  Tuvo diarrea durante ms de 6 horas.  Ha estado enfermo o ha tenido fiebre durante un par de das y no mejora. SOLICITE ATENCIN MDICA DE INMEDIATO SI:   Tiene dificultad  para respirar.  Ya no siente los movimientos del beb.  Est sangrando o tiene flujo vaginal.  Comienza a tener contracciones o trabajo de parto prematuro. ASEGRESE DE QUE:  Comprende estas instrucciones.  Controlar su afeccin.  Recibir ayuda de inmediato si no mejora o si empeora. Document Released: 09/26/2005 Document Revised: 05/03/2014 [employer] Patient Information 2015 [employer]. This information is not intended to replace advice given to you by your health care provider. Make sure you discuss any questions you have with your health care provider.  Lactancia materna (Breastfeeding) Decidir amamantar es una de las mejores elecciones que puede hacer por usted y su beb. El cambio hormonal durante el embarazo produce el desarrollo del tejido mamario y aumenta la cantidad   y el tamao de los conductos galactforos. Estas hormonas tambin permiten que las protenas, los azcares y las grasas de la sangre produzcan la leche materna en las glndulas productoras de leche. Las hormonas impiden que la leche materna sea liberada antes del nacimiento del beb, adems de impulsar el flujo de leche luego del nacimiento. Una vez que ha comenzado a amamantar, pensar en el beb, as como la succin o el llanto, pueden estimular la liberacin de leche de las glndulas productoras de leche.  LOS BENEFICIOS DE AMAMANTAR Para el beb  La primera leche (calostro) ayuda a mejorar el funcionamiento del sistema digestivo del beb.  La leche tiene anticuerpos que ayudan a prevenir las infecciones en el beb.  El beb tiene una menor incidencia de asma, alergias y del sndrome de muerte sbita del lactante.  Los nutrientes en la leche materna son mejores para el beb que la leche maternizada y estn preparados exclusivamente para cubrir las necesidades del beb.  La leche materna mejora el desarrollo cerebral del beb.  Es menos probable que el beb desarrolle otras enfermedades, como obesidad  infantil, asma o diabetes mellitus de tipo 2. Para usted   La lactancia materna favorece el desarrollo de un vnculo muy especial entre la madre y el beb.  Es conveniente. La leche materna siempre est disponible a la temperatura correcta y es econmica.  La lactancia materna ayuda a quemar caloras y a perder el peso ganado durante el embarazo.  Favorece la contraccin del tero al tamao que tena antes del embarazo de manera ms rpida y disminuye el sangrado (loquios) despus del parto.  La lactancia materna contribuye a reducir el riesgo de desarrollar diabetes mellitus de tipo 2, osteoporosis o cncer de mama o de ovario en el futuro. SIGNOS DE QUE EL BEB EST HAMBRIENTO Primeros signos de hambre  Aumenta su estado de alerta o actividad.  Se estira.  Mueve la cabeza de un lado a otro.  Mueve la cabeza y abre la boca cuando se le toca la mejilla o la comisura de la boca (reflejo de bsqueda).  Aumenta las vocalizaciones, tales como sonidos de succin, se relame los labios, emite arrullos, suspiros, o chirridos.  Mueve la mano hacia la boca.  Se chupa con ganas los dedos o las manos. Signos tardos de hambre  Est agitado.  Llora de manera intermitente. Signos de hambre extrema Los signos de hambre extrema requerirn que lo calme y lo consuele antes de que el beb pueda alimentarse adecuadamente. No espere a que se manifiesten los siguientes signos de hambre extrema para comenzar a amamantar:   Agitacin.  Llanto intenso y fuerte.   Gritos. INFORMACIN BSICA SOBRE LA LACTANCIA MATERNA Iniciacin de la lactancia materna  Encuentre un lugar cmodo para sentarse o acostarse, con un buen respaldo para el cuello y la espalda.  Coloque una almohada o una manta enrollada debajo del beb para acomodarlo a la altura de la mama (si est sentada). Las almohadas para amamantar se han diseado especialmente a fin de servir de apoyo para los brazos y el beb mientras  amamanta.  Asegrese de que el abdomen del beb est frente al suyo.  Masajee suavemente la mama. Con las yemas de los dedos, masajee la pared del pecho hacia el pezn en un movimiento circular. Esto estimula el flujo de leche. Es posible que deba continuar este movimiento mientras amamanta si la leche fluye lentamente.  Sostenga la mama con el pulgar por arriba del pezn y los otros   4 dedos por debajo de la mama. Asegrese de que los dedos se encuentren lejos del pezn y de la boca del beb.  Empuje suavemente los labios del beb con el pezn o con el dedo.  Cuando la boca del beb se abra lo suficiente, acrquelo rpidamente a la mama e introduzca todo el pezn y la zona oscura que lo rodea (areola), tanto como sea posible, dentro de la boca del beb.  Debe haber ms areola visible por arriba del labio superior del beb que por debajo del labio inferior.  La lengua del beb debe estar entre la enca inferior y la mama.  Asegrese de que la boca del beb est en la posicin correcta alrededor del pezn (prendida). Los labios del beb deben crear un sello sobre la mama y estar doblados hacia afuera (invertidos).  Es comn que el beb succione durante 2 a 3 minutos para que comience el flujo de leche materna. Cmo debe prenderse Es muy importante que le ensee al beb cmo prenderse adecuadamente a la mama. Si el beb no se prende adecuadamente, puede causarle dolor en el pezn y reducir la produccin de leche materna, y hacer que el beb tenga un escaso aumento de peso. Adems, si el beb no se prende adecuadamente al pezn, puede tragar aire durante la alimentacin. Esto puede causarle molestias al beb. Hacer eructar al beb al cambiar de mama puede ayudarlo a liberar el aire. Sin embargo, ensearle al beb cmo prenderse a la mama adecuadamente es la mejor manera de evitar que se sienta molesto por tragar aire mientras se alimenta. Signos de que el beb se ha prendido adecuadamente al pezn:    Tironea o succiona de modo silencioso, sin causarle dolor.  Se escucha que traga cada 3 o 4 succiones.   Hay movimientos musculares por arriba y por delante de sus odos al succionar. Signos de que el beb no se ha prendido adecuadamente al pezn:   Hace ruidos de succin o de chasquido mientras se alimenta.  Siente dolor en el pezn. Si cree que el beb no se prendi correctamente, deslice el dedo en la comisura de la boca y colquelo entre las encas del beb para interrumpir la succin. Intente comenzar a amamantar nuevamente. Signos de lactancia materna exitosa Signos del beb:   Disminuye gradualmente el nmero de succiones o cesa la succin por completo.  Se duerme.  Relaja el cuerpo.  Retiene una pequea cantidad de leche en la boca.  Se desprende solo del pecho. Signos que presenta usted:  Las mamas han aumentado la firmeza, el peso y el tamao 1 a 3 horas despus de amamantar.  Estn ms blandas inmediatamente despus de amamantar.  Un aumento del volumen de leche, y tambin un cambio en su consistencia y color se producen hacia el quinto da de lactancia materna.  Los pezones no duelen, ni estn agrietados ni sangran. Signos de que su beb recibe la cantidad de leche suficiente  Moja al menos 3 paales en 24 horas. La orina debe ser clara y de color amarillo plido a los 5 das de vida.  Defeca al menos 3 veces en 24 horas a los 5 das de vida. La materia fecal debe ser blanda y amarillenta.  Defeca al menos 3 veces en 24 horas a los 7 das de vida. La materia fecal debe ser grumosa y amarillenta.  No registra una prdida de peso mayor del 10% del peso al nacer durante los primeros 3 das de vida.    Aumenta de peso un promedio de 4 a 7onzas (113 a 198g) por semana despus de los 4 das de vida.  Aumenta de peso, diariamente, de manera uniforme a partir de los 5 das de vida, sin registrar prdida de peso despus de las 2semanas de vida. Despus de  alimentarse, es posible que el beb regurgite una pequea cantidad. Esto es frecuente. FRECUENCIA Y DURACIN DE LA LACTANCIA MATERNA El amamantamiento frecuente la ayudar a producir ms leche y a prevenir problemas de dolor en los pezones e hinchazn en las mamas. Alimente al beb cuando muestre signos de hambre o si siente la necesidad de reducir la congestin de las mamas. Esto se denomina "lactancia a demanda". Evite el uso del chupete mientras trabaja para establecer la lactancia (las primeras 4 a 6 semanas despus del nacimiento del beb). Despus de este perodo, podr ofrecerle un chupete. Las investigaciones demostraron que el uso del chupete durante el primer ao de vida del beb disminuye el riesgo de desarrollar el sndrome de muerte sbita del lactante (SMSL). Permita que el nio se alimente en cada mama todo lo que desee. Contine amamantando al beb hasta que haya terminado de alimentarse. Cuando el beb se desprende o se queda dormido mientras se est alimentando de la primera mama, ofrzcale la segunda. Debido a que, con frecuencia, los recin nacidos permanecen somnolientos las primeras semanas de vida, es posible que deba despertar al beb para alimentarlo. Los horarios de lactancia varan de un beb a otro. Sin embargo, las siguientes reglas pueden servir como gua para ayudarla a garantizar que el beb se alimenta adecuadamente:  Se puede amamantar a los recin nacidos (bebs de 4 semanas o menos de vida) cada 1 a 3 horas.  No deben transcurrir ms de 3 horas durante el da o 5 horas durante la noche sin que se amamante a los recin nacidos.  Debe amamantar al beb 8 veces como mnimo en un perodo de 24 horas, hasta que comience a introducir slidos en su dieta, a los 6 meses de vida aproximadamente. EXTRACCIN DE LECHE MATERNA La extraccin y el almacenamiento de la leche materna le permiten asegurarse de que el beb se alimente exclusivamente de leche materna, aun en momentos en  los que no puede amamantar. Esto tiene especial importancia si debe regresar al trabajo en el perodo en que an est amamantando o si no puede estar presente en los momentos en que el beb debe alimentarse. Su asesor en lactancia puede orientarla sobre cunto tiempo es seguro almacenar leche materna.  El sacaleche es un aparato que le permite extraer leche de la mama a un recipiente estril. Luego, la leche materna extrada puede almacenarse en un refrigerador o congelador. Algunos sacaleches son manuales, mientras que otros son elctricos. Consulte a su asesor en lactancia qu tipo ser ms conveniente para usted. Los sacaleches se pueden comprar; sin embargo, algunos hospitales y grupos de apoyo a la lactancia materna alquilan sacaleches mensualmente. Un asesor en lactancia puede ensearle cmo extraer leche materna manualmente, en caso de que prefiera no usar un sacaleche.  CMO CUIDAR LAS MAMAS DURANTE LA LACTANCIA MATERNA Los pezones se secan, agrietan y duelen durante la lactancia materna. Las siguientes recomendaciones pueden ayudarla a mantener las mamas humectadas y sanas:  Evite usar jabn en los pezones.  Use un sostn de soporte. Aunque no son esenciales, las camisetas sin mangas o los sostenes especiales para amamantar estn diseados para acceder fcilmente a las mamas, para amamantar sin tener que quitarse todo   el sostn o la camiseta. Evite usar sostenes con aro o sostenes muy ajustados.  Seque al aire sus pezones durante 3 a 4minutos despus de amamantar al beb.  Utilice solo apsitos de algodn en el sostn para absorber las prdidas de leche. La prdida de un poco de leche materna entre las tomas es normal.  Utilice lanolina sobre los pezones luego de amamantar. La lanolina ayuda a mantener la humedad normal de la piel. Si usa lanolina pura, no tiene que lavarse los pezones antes de volver a alimentar al beb. La lanolina pura no es txica para el beb. Adems, puede extraer  manualmente algunas gotas de leche materna y masajear suavemente esa leche sobre los pezones, para que la leche se seque al aire. Durante las primeras semanas despus de dar a luz, algunas mujeres pueden experimentar hinchazn en las mamas (congestin mamaria). La congestin puede hacer que sienta las mamas pesadas, calientes y sensibles al tacto. El pico de la congestin ocurre dentro de los 3 a 5 das despus del parto. Las siguientes recomendaciones pueden ayudarla a aliviar la congestin:  Vace por completo las mamas al amamantar o extraer leche. Puede aplicar calor hmedo en las mamas (en la ducha o con toallas hmedas para manos) antes de amamantar o extraer leche. Esto aumenta la circulacin y ayuda a que la leche fluya. Si el beb no vaca por completo las mamas cuando lo amamanta, extraiga la leche restante despus de que haya finalizado.  Use un sostn ajustado (para amamantar o comn) o una camiseta sin mangas durante 1 o 2 das para indicar al cuerpo que disminuya ligeramente la produccin de leche.  Aplique compresas de hielo sobre las mamas, a menos que le resulte demasiado incmodo.  Asegrese de que el beb est prendido y se encuentre en la posicin correcta mientras lo alimenta. Si la congestin persiste luego de 48 horas o despus de seguir estas recomendaciones, comunquese con su mdico o un asesor en lactancia. RECOMENDACIONES GENERALES PARA EL CUIDADO DE LA SALUD DURANTE LA LACTANCIA MATERNA  Consuma alimentos saludables. Alterne comidas y colaciones, y coma 3 de cada una por da. Dado que lo que come afecta la leche materna, es posible que algunas comidas hagan que su beb se vuelva ms irritable de lo habitual. Evite comer este tipo de alimentos si percibe que afectan de manera negativa al beb.  Beba leche, jugos de fruta y agua para satisfacer su sed (aproximadamente 10 vasos al da).  Descanse con frecuencia, reljese y tome sus vitaminas prenatales para evitar la  fatiga, el estrs y la anemia.  Contine con los autocontroles de la mama.  Evite masticar y fumar tabaco.  Evite el consumo de alcohol y drogas. Algunos medicamentos, que pueden ser perjudiciales para el beb, pueden pasar a travs de la leche materna. Es importante que consulte a su mdico antes de tomar cualquier medicamento, incluidos todos los medicamentos recetados y de venta libre, as como los suplementos vitamnicos y herbales. Puede quedar embarazada durante la lactancia. Si desea controlar la natalidad, consulte a su mdico cules son las opciones ms seguras para el beb. SOLICITE ATENCIN MDICA SI:   Usted siente que quiere dejar de amamantar o se siente frustrada con la lactancia.  Siente dolor en las mamas o en los pezones.  Sus pezones estn agrietados o sangran.  Sus pechos estn irritados, sensibles o calientes.  Tiene un rea hinchada en cualquiera de las mamas.  Siente escalofros o fiebre.  Tiene nuseas o vmitos.    Presenta una secrecin de otro lquido distinto de la leche materna de los pezones.  Sus mamas no se llenan antes de amamantar al beb para el quinto da despus del parto.  Se siente triste y deprimida.  El beb est demasiado somnoliento como para comer bien.  El beb tiene problemas para dormir.  Moja menos de 3 paales en 24 horas.  Defeca menos de 3 veces en 24 horas.  La piel del beb o la parte blanca de los ojos se vuelven amarillentas.  El beb no ha aumentado de peso a los 5 das de vida. SOLICITE ATENCIN MDICA DE INMEDIATO SI:   El beb est muy cansado (letargo) y no se quiere despertar para comer.  Le sube la fiebre sin causa. Document Released: 12/17/2005 Document Revised: 12/22/2013 ExitCare Patient Information 2015 [employer]. This information is not intended to replace advice given to you by your health care provider. Make sure you discuss any questions you have with your health care provider.  

## 2014-12-27 NOTE — Progress Notes (Signed)
Used Interpreter Viviana SimplerAlis Herrera

## 2014-12-27 NOTE — Progress Notes (Signed)
Growth U/S 01/10/15 @ 4p with Radiology.  Spanish interpreter C. Ardyth HarpsHernandez present.

## 2014-12-31 NOTE — L&D Delivery Note (Signed)
Patient is 29 y.o. [id number] admitted for SROM, hx of gestational DM, h/o pre-eclampsia with prior pregnancies but not diagnosed during this pregnancy   Delivery Note At 8:44 PM a healthy female was delivered via Vaginal, Spontaneous Delivery (Presentation: ; Occiput Anterior).  APGAR: 8, 9; weight  Pending. Nuchal cord noted.   Placenta status: Intact, Spontaneous.  Cord: 3 vessels with the following complications: None.   Anesthesia: None  Episiotomy: None Lacerations: None Suture Repair: N/A Est. Blood Loss (mL):  150cc  Mom to postpartum.  Baby to Couplet care / Skin to Skin.  Araceli BoucheRumley, [place] N 03/20/2015, 8:56 PM

## 2015-01-03 ENCOUNTER — Encounter: Payer: Self-pay | Admitting: Obstetrics and Gynecology

## 2015-01-10 ENCOUNTER — Ambulatory Visit (INDEPENDENT_AMBULATORY_CARE_PROVIDER_SITE_OTHER): Payer: Self-pay | Admitting: Obstetrics & Gynecology

## 2015-01-10 ENCOUNTER — Ambulatory Visit (HOSPITAL_COMMUNITY)
Admission: RE | Admit: 2015-01-10 | Discharge: 2015-01-10 | Disposition: A | Discharge status: Home / Self Care | Payer: Self-pay | Source: Ambulatory Visit | Attending: Family Medicine | Admitting: Family Medicine

## 2015-01-10 VITALS — BP 114/57 | HR 95 | Temp 98.5°F | Wt 146.8 lb

## 2015-01-10 DIAGNOSIS — O360920 Maternal care for other rhesus isoimmunization, second trimester, not applicable or unspecified: Secondary | ICD-10-CM

## 2015-01-10 DIAGNOSIS — O24410 Gestational diabetes mellitus in pregnancy, diet controlled: Secondary | ICD-10-CM | POA: Insufficient documentation

## 2015-01-10 DIAGNOSIS — O24419 Gestational diabetes mellitus in pregnancy, unspecified control: Secondary | ICD-10-CM

## 2015-01-10 DIAGNOSIS — O0992 Supervision of high risk pregnancy, unspecified, second trimester: Secondary | ICD-10-CM

## 2015-01-10 DIAGNOSIS — Z3A28 28 weeks gestation of pregnancy: Secondary | ICD-10-CM | POA: Insufficient documentation

## 2015-01-10 MED ORDER — TETANUS-DIPHTH-ACELL PERTUSSIS 5-2.5-18.5 LF-MCG/0.5 IM SUSP
0.5000 mL | Freq: Once | INTRAMUSCULAR | Status: AC
  Administered 2015-01-10: 0.5 mL via INTRAMUSCULAR

## 2015-01-10 MED ORDER — RHO D IMMUNE GLOBULIN 1500 UNIT/2ML IJ SOSY
300.0000 ug | PREFILLED_SYRINGE | Freq: Once | INTRAMUSCULAR | Status: AC
  Administered 2015-01-10: 300 ug via INTRAMUSCULAR

## 2015-01-10 NOTE — Progress Notes (Signed)
Fetal echo not on chart.  Will have RN look. CBGs all less than 90; all pp are less than 120.  Needs rhogam and tdap

## 2015-01-10 NOTE — Progress Notes (Signed)
Used blanca for interpreter

## 2015-01-11 NOTE — Progress Notes (Signed)
Callled Dr. Casilda Carlsotton's office-- fetal echo report to be faxed to clinic today.

## 2015-01-12 LAB — POCT URINALYSIS DIP (DEVICE)
Bilirubin Urine: NEGATIVE
Glucose, UA: NEGATIVE mg/dL
HGB URINE DIPSTICK: NEGATIVE
Ketones, ur: NEGATIVE mg/dL
Nitrite: NEGATIVE
PROTEIN: NEGATIVE mg/dL
Specific Gravity, Urine: 1.02 (ref 1.005–1.030)
UROBILINOGEN UA: 1 mg/dL (ref 0.0–1.0)
pH: 6.5 (ref 5.0–8.0)

## 2015-01-14 ENCOUNTER — Encounter: Payer: Self-pay | Admitting: *Deleted

## 2015-01-24 ENCOUNTER — Ambulatory Visit (INDEPENDENT_AMBULATORY_CARE_PROVIDER_SITE_OTHER): Payer: Self-pay | Admitting: Obstetrics and Gynecology

## 2015-01-24 VITALS — BP 115/60 | HR 100 | Temp 98.4°F | Wt 146.8 lb

## 2015-01-24 DIAGNOSIS — O24419 Gestational diabetes mellitus in pregnancy, unspecified control: Secondary | ICD-10-CM

## 2015-01-24 LAB — POCT URINALYSIS DIP (DEVICE)
BILIRUBIN URINE: NEGATIVE
Glucose, UA: NEGATIVE mg/dL
Hgb urine dipstick: NEGATIVE
KETONES UR: NEGATIVE mg/dL
NITRITE: NEGATIVE
PH: 7 (ref 5.0–8.0)
Protein, ur: NEGATIVE mg/dL
SPECIFIC GRAVITY, URINE: 1.015 (ref 1.005–1.030)
UROBILINOGEN UA: 0.2 mg/dL (ref 0.0–1.0)

## 2015-01-24 NOTE — Progress Notes (Signed)
Doris Bishop used as interpreter for this encounter.  

## 2015-01-24 NOTE — Progress Notes (Signed)
Pt doing well, no complaints. Denies contractions, LOF, VB. + FM. No dizziness, HA, N/V.  #) B / A2GDM - Only 3 of 15 CBGs in AM > 90 (all < 100), only 4 > 120 (all < 130).  - Continue glyburide and ASA - NST to start in 32 wks.  #) H/o pre-eclampsia - BP well controlled. No s/sx pre eclampsia.  RTC 2 wks

## 2015-02-07 ENCOUNTER — Ambulatory Visit (INDEPENDENT_AMBULATORY_CARE_PROVIDER_SITE_OTHER): Payer: Self-pay | Admitting: Obstetrics and Gynecology

## 2015-02-07 ENCOUNTER — Encounter: Payer: Self-pay | Admitting: Obstetrics and Gynecology

## 2015-02-07 VITALS — BP 118/71 | HR 98 | Wt 149.1 lb

## 2015-02-07 DIAGNOSIS — O09213 Supervision of pregnancy with history of pre-term labor, third trimester: Secondary | ICD-10-CM

## 2015-02-07 DIAGNOSIS — Z789 Other specified health status: Secondary | ICD-10-CM

## 2015-02-07 DIAGNOSIS — Z6741 Type O blood, Rh negative: Secondary | ICD-10-CM

## 2015-02-07 DIAGNOSIS — O0992 Supervision of high risk pregnancy, unspecified, second trimester: Secondary | ICD-10-CM

## 2015-02-07 DIAGNOSIS — O24419 Gestational diabetes mellitus in pregnancy, unspecified control: Secondary | ICD-10-CM

## 2015-02-07 DIAGNOSIS — O09293 Supervision of pregnancy with other poor reproductive or obstetric history, third trimester: Secondary | ICD-10-CM

## 2015-02-07 LAB — US OB FOLLOW UP

## 2015-02-07 LAB — POCT URINALYSIS DIP (DEVICE)
BILIRUBIN URINE: NEGATIVE
Glucose, UA: NEGATIVE mg/dL
Hgb urine dipstick: NEGATIVE
Ketones, ur: NEGATIVE mg/dL
Nitrite: NEGATIVE
Protein, ur: NEGATIVE mg/dL
SPECIFIC GRAVITY, URINE: 1.02 (ref 1.005–1.030)
Urobilinogen, UA: 0.2 mg/dL (ref 0.0–1.0)
pH: 6.5 (ref 5.0–8.0)

## 2015-02-07 NOTE — Progress Notes (Signed)
Patient is doing well without complaints. FM/PTL precautions reviewed CBGs all within range- continue glyburide qHS F/U growth ultrasound scheduled for 2/15 NST reviewed and reactive

## 2015-02-07 NOTE — Progress Notes (Signed)
US for growth scheduled 2/15.  Interpreter - Maretta LosBlanca Lindner present for encounter.

## 2015-02-11 ENCOUNTER — Ambulatory Visit (INDEPENDENT_AMBULATORY_CARE_PROVIDER_SITE_OTHER): Payer: Self-pay | Admitting: *Deleted

## 2015-02-11 VITALS — BP 116/67 | HR 88

## 2015-02-11 DIAGNOSIS — O24419 Gestational diabetes mellitus in pregnancy, unspecified control: Secondary | ICD-10-CM

## 2015-02-11 NOTE — Progress Notes (Signed)
nst reactive

## 2015-02-14 ENCOUNTER — Other Ambulatory Visit: Payer: Self-pay

## 2015-02-14 ENCOUNTER — Ambulatory Visit (HOSPITAL_COMMUNITY)
Admission: RE | Admit: 2015-02-14 | Discharge: 2015-02-14 | Disposition: A | Discharge status: Home / Self Care | Payer: Self-pay | Source: Ambulatory Visit | Attending: Obstetrics and Gynecology | Admitting: Obstetrics and Gynecology

## 2015-02-14 DIAGNOSIS — O24414 Gestational diabetes mellitus in pregnancy, insulin controlled: Secondary | ICD-10-CM | POA: Insufficient documentation

## 2015-02-14 DIAGNOSIS — O24419 Gestational diabetes mellitus in pregnancy, unspecified control: Secondary | ICD-10-CM

## 2015-02-14 DIAGNOSIS — Z3A33 33 weeks gestation of pregnancy: Secondary | ICD-10-CM | POA: Insufficient documentation

## 2015-02-18 ENCOUNTER — Ambulatory Visit (INDEPENDENT_AMBULATORY_CARE_PROVIDER_SITE_OTHER): Payer: Self-pay | Admitting: *Deleted

## 2015-02-18 VITALS — BP 114/60 | HR 90

## 2015-02-18 DIAGNOSIS — O24419 Gestational diabetes mellitus in pregnancy, unspecified control: Secondary | ICD-10-CM

## 2015-02-18 DIAGNOSIS — Z3A33 33 weeks gestation of pregnancy: Secondary | ICD-10-CM

## 2015-02-18 NOTE — Progress Notes (Signed)
2/19 NST reviewed and reactive 

## 2015-02-21 ENCOUNTER — Ambulatory Visit (INDEPENDENT_AMBULATORY_CARE_PROVIDER_SITE_OTHER): Payer: Self-pay | Admitting: Obstetrics & Gynecology

## 2015-02-21 VITALS — BP 126/60 | HR 89 | Temp 98.4°F | Wt 148.7 lb

## 2015-02-21 DIAGNOSIS — O24419 Gestational diabetes mellitus in pregnancy, unspecified control: Secondary | ICD-10-CM

## 2015-02-21 DIAGNOSIS — O0993 Supervision of high risk pregnancy, unspecified, third trimester: Secondary | ICD-10-CM

## 2015-02-21 LAB — POCT URINALYSIS DIP (DEVICE)
Bilirubin Urine: NEGATIVE
GLUCOSE, UA: NEGATIVE mg/dL
Ketones, ur: NEGATIVE mg/dL
Nitrite: NEGATIVE
PROTEIN: NEGATIVE mg/dL
Specific Gravity, Urine: 1.01 (ref 1.005–1.030)
Urobilinogen, UA: 0.2 mg/dL (ref 0.0–1.0)
pH: 6.5 (ref 5.0–8.0)

## 2015-02-21 LAB — US OB FOLLOW UP

## 2015-02-21 NOTE — Progress Notes (Signed)
NST today reactive. Growth US 8 days ago 65%ile, repeat 4 weeks. FBS and pp in range except rare pp>130

## 2015-02-21 NOTE — Patient Instructions (Signed)
Tercer trimestre de embarazo (Third Trimester of Pregnancy) El tercer trimestre va desde la semana29 hasta la 42, desde el sptimo hasta el noveno mes, y es la poca en la que el feto crece ms rpidamente. Hacia el final del noveno mes, el feto mide alrededor de 20pulgadas (45cm) de largo y pesa entre 6 y 10 libras (2,700 y 4,500kg).  CAMBIOS EN EL ORGANISMO Su organismo atraviesa por muchos cambios durante el embarazo, y estos varan de una mujer a otra.   Seguir aumentando de peso. Es de esperar que aumente entre 25 y 35libras (11 y 16kg) hacia el final del embarazo.  Podrn aparecer las primeras estras en las caderas, el abdomen y las mamas.  Puede tener necesidad de orinar con ms frecuencia porque el feto baja hacia la pelvis y ejerce presin sobre la vejiga.  Debido al embarazo podr sentir acidez estomacal con frecuencia.  Puede estar estreida, ya que ciertas hormonas enlentecen los movimientos de los msculos que empujan los desechos a travs de los intestinos.  Pueden aparecer hemorroides o abultarse e hincharse las venas (venas varicosas).  Puede sentir dolor plvico debido al aumento de peso y a que las hormonas del embarazo relajan las articulaciones entre los huesos de la pelvis. El dolor de espalda puede ser consecuencia de la sobrecarga de los msculos que soportan la postura.  Tal vez haya cambios en el cabello que pueden incluir su engrosamiento, crecimiento rpido y cambios en la textura. Adems, a algunas mujeres se les cae el cabello durante o despus del embarazo, o tienen el cabello seco o fino. Lo ms probable es que el cabello se le normalice despus del nacimiento del beb.  Las mamas seguirn creciendo y le dolern. A veces, puede haber una secrecin amarilla de las mamas llamada calostro.  El ombligo puede salir hacia afuera.  Puede sentir que le falta el aire debido a que se expande el tero.  Puede notar que el feto "baja" o lo siente ms bajo, en el  abdomen.  Puede tener una prdida de secrecin mucosa con sangre. Esto suele ocurrir en el trmino de unos pocos das a una semana antes de que comience el trabajo de parto.  El cuello del tero se vuelve delgado y blando (se borra) cerca de la fecha de parto. QU DEBE ESPERAR EN LOS EXMENES PRENATALES  Le harn exmenes prenatales cada 2semanas hasta la semana36. A partir de ese momento le harn exmenes semanales. Durante una visita prenatal de rutina:  La pesarn para asegurarse de que usted y el feto estn creciendo normalmente.  Le tomarn la presin arterial.  Le medirn el abdomen para controlar el desarrollo del beb.  Se escucharn los latidos cardacos fetales.  Se evaluarn los resultados de los estudios solicitados en visitas anteriores.  Le revisarn el cuello del tero cuando est prxima la fecha de parto para controlar si este se ha borrado. Alrededor de la semana36, el mdico le revisar el cuello del tero. Al mismo tiempo, realizar un anlisis de las secreciones del tejido vaginal. Este examen es para determinar si hay un tipo de bacteria, estreptococo Grupo B. El mdico le explicar esto con ms detalle. El mdico puede preguntarle lo siguiente:  Cmo le gustara que fuera el parto.  Cmo se siente.  Si siente los movimientos del beb.  Si ha tenido sntomas anormales, como prdida de lquido, sangrado, dolores de cabeza intensos o clicos abdominales.  Si tiene alguna pregunta. Otros exmenes o estudios de deteccin que pueden realizarse   durante el tercer trimestre incluyen lo siguiente:  Anlisis de sangre para controlar las concentraciones de hierro (anemia).  Controles fetales para determinar su salud, nivel de actividad y crecimiento. Si tiene alguna enfermedad o hay problemas durante el embarazo, le harn estudios. FALSO TRABAJO DE PARTO Es posible que sienta contracciones leves e irregulares que finalmente desaparecen. Se llaman contracciones de  Braxton Hicks o falso trabajo de parto. Las contracciones pueden durar horas, das o incluso semanas, antes de que el verdadero trabajo de parto se inicie. Si las contracciones ocurren a intervalos regulares, se intensifican o se hacen dolorosas, lo mejor es que la revise el mdico.  SIGNOS DE TRABAJO DE PARTO   Clicos de tipo menstrual.  Contracciones cada 5minutos o menos.  Contracciones que comienzan en la parte superior del tero y se extienden hacia abajo, a la zona inferior del abdomen y la espalda.  Sensacin de mayor presin en la pelvis o dolor de espalda.  Una secrecin de mucosidad acuosa o con sangre que sale de la vagina. Si tiene alguno de estos signos antes de la semana37 del embarazo, llame a su mdico de inmediato. Debe concurrir al hospital para que la controlen inmediatamente. INSTRUCCIONES PARA EL CUIDADO EN EL HOGAR   Evite fumar, consumir hierbas, beber alcohol y tomar frmacos que no le hayan recetado. Estas sustancias qumicas afectan la formacin y el desarrollo del beb.  Siga las indicaciones del mdico en relacin con el uso de medicamentos. Durante el embarazo, hay medicamentos que son seguros de tomar y otros que no.  Haga actividad fsica solo en la forma indicada por el mdico. Sentir clicos uterinos es un buen signo para detener la actividad fsica.  Contine comiendo alimentos que sanos con regularidad.  Use un sostn que le brinde buen soporte si le duelen las mamas.  No se d baos de inmersin en agua caliente, baos turcos ni saunas.  Colquese el cinturn de seguridad cuando conduzca.  No coma carne cruda ni queso sin cocinar; evite el contacto con las bandejas sanitarias de los gatos y la tierra que estos animales usan. Estos elementos contienen grmenes que pueden causar defectos congnitos en el beb.  Tome las vitaminas prenatales.  Si est estreida, pruebe un laxante suave (si el mdico lo autoriza). Consuma ms alimentos ricos en  fibra, como vegetales y frutas frescos y cereales integrales. Beba gran cantidad de lquido para mantener la orina de tono claro o color amarillo plido.  Dese baos de asiento con agua tibia para aliviar el dolor o las molestias causadas por las hemorroides. Use una crema para las hemorroides si el mdico la autoriza.  Si tiene venas varicosas, use medias de descanso. Eleve los pies durante 15minutos, 3 o 4veces por da. Limite la cantidad de sal en su dieta.  Evite levantar objetos pesados, use zapatos de tacones bajos y mantenga una buena postura.  Descanse con las piernas elevadas si tiene calambres o dolor de cintura.  Visite a su dentista si no lo ha hecho durante el embarazo. Use un cepillo de dientes blando para higienizarse los dientes y psese el hilo dental con suavidad.  Puede seguir manteniendo relaciones sexuales, a menos que el mdico le indique lo contrario.  No haga viajes largos excepto que sea absolutamente necesario y solo con la autorizacin del mdico.  Tome clases prenatales para entender, practicar y hacer preguntas sobre el trabajo de parto y el parto.  Haga un ensayo de la partida al hospital.  Prepare el bolso que   llevar al hospital.  Prepare la habitacin del beb.  Concurra a todas las visitas prenatales segn las indicaciones de su mdico. SOLICITE ATENCIN MDICA SI:  No est segura de que est en trabajo de parto o de que ha roto la bolsa de las aguas.  Tiene mareos.  Siente clicos leves, presin en la pelvis o dolor persistente en el abdomen.  Tiene nuseas, vmitos o diarrea persistentes.  Tiene secrecin vaginal con mal olor.  Siente dolor al orinar. SOLICITE ATENCIN MDICA DE INMEDIATO SI:   Tiene fiebre.  Tiene una prdida de lquido por la vagina.  Tiene sangrado o pequeas prdidas vaginales.  Siente dolor intenso o clicos en el abdomen.  Sube o baja de peso rpidamente.  Tiene dificultad para respirar y siente dolor de  pecho.  Sbitamente se le hinchan mucho el rostro, las manos, los tobillos, los pies o las piernas.  No ha sentido los movimientos del beb durante una hora.  Siente un dolor de cabeza intenso que no se alivia con medicamentos.  Hay cambios en la visin. Document Released: 09/26/2005 Document Revised: 12/22/2013 ExitCare Patient Information 2015 [employer]. This information is not intended to replace advice given to you by your health care provider. Make sure you discuss any questions you have with your health care provider.  

## 2015-02-21 NOTE — Progress Notes (Signed)
Doris Bishop Spanish Interpreter for encounter

## 2015-02-23 ENCOUNTER — Encounter: Payer: Self-pay | Admitting: Obstetrics & Gynecology

## 2015-02-25 ENCOUNTER — Ambulatory Visit (INDEPENDENT_AMBULATORY_CARE_PROVIDER_SITE_OTHER): Payer: Self-pay | Admitting: *Deleted

## 2015-02-25 VITALS — BP 122/66 | HR 85

## 2015-02-25 DIAGNOSIS — O24419 Gestational diabetes mellitus in pregnancy, unspecified control: Secondary | ICD-10-CM

## 2015-02-25 NOTE — Progress Notes (Signed)
NST reactive.

## 2015-02-25 NOTE — Progress Notes (Signed)
Pt reports increased fetal activity at night - sometimes until 4 am.

## 2015-02-28 ENCOUNTER — Other Ambulatory Visit: Payer: Self-pay

## 2015-02-28 ENCOUNTER — Ambulatory Visit (INDEPENDENT_AMBULATORY_CARE_PROVIDER_SITE_OTHER): Payer: Self-pay | Admitting: Family Medicine

## 2015-02-28 VITALS — BP 118/78 | HR 90 | Wt 148.4 lb

## 2015-02-28 DIAGNOSIS — O24419 Gestational diabetes mellitus in pregnancy, unspecified control: Secondary | ICD-10-CM

## 2015-02-28 LAB — US OB FOLLOW UP

## 2015-02-28 LAB — POCT URINALYSIS DIP (DEVICE)
Bilirubin Urine: NEGATIVE
Glucose, UA: NEGATIVE mg/dL
HGB URINE DIPSTICK: NEGATIVE
KETONES UR: NEGATIVE mg/dL
Nitrite: NEGATIVE
PH: 5.5 (ref 5.0–8.0)
PROTEIN: NEGATIVE mg/dL
UROBILINOGEN UA: 0.2 mg/dL (ref 0.0–1.0)

## 2015-02-28 NOTE — Progress Notes (Signed)
Interpreter Carol Hernandez present for encounter.   

## 2015-02-28 NOTE — Progress Notes (Signed)
2hr PP 4/21 elevated (80-167) Fasting - all controlled. NST reactive AFI today - 10.5 Continue twice weekly testing Glyburide 2.5mg  in evening. Fm and labor precautions given

## 2015-02-28 NOTE — Patient Instructions (Signed)
Tercer trimestre del embarazo  (Third Trimester of Pregnancy)  El tercer trimestre del embarazo abarca desde la semana 29 hasta la semana 42, desde el 7 mes hasta el 9. En este trimestre el beb (feto) se desarrolla muy rpidamente. Hacia el final del noveno mes, el beb que an no ha nacido mide alrededor de 20 pulgadas (45 cm) de largo. Y pesa entre 6 y 10 libras (2,700 y 4,500 kg).  CUIDADOS EN EL HOGAR   Evite fumar, consumir hierbas y beber alcohol. Evite los frmacos que no apruebe el mdico.  Slo tome los medicamentos que le haya indicado su mdico. Algunos medicamentos son seguros para tomar durante el embarazo y otros no lo son.  Haga ejercicios slo como le indique el mdico. Deje de hacer ejercicios si comienza a tener clicos.  Haga comidas regulares y sanas.  Use un sostn que le brinde buen soporte si sus mamas estn sensibles.  No utilice la baera con agua caliente, baos turcos y saunas.  Colquese el cinturn de seguridad cuando conduzca.  Evite comer carne cruda y el contacto con los utensilios y desperdicios de los gatos.  Tome las vitaminas indicadas para la etapa prenatal.  Trate de tomar medicamentos para mover el intestino (laxantes) segn lo necesario y si su mdico la autoriza. Consuma ms fibra comiendo frutas y vegetales frescos y granos enteros. Beba gran cantidad de lquido para mantener el pis (orina) de tono claro o amarillo plido.  Tome baos de agua tibia (baos de asiento) para calmar el dolor o las molestias causadas por las hemorroides. Use una crema para las hemorroides si el mdico la autoriza.  Si tiene venas hinchadas y abultadas (venas varicosas), use medias de soporte. Eleve (levante) los pies durante 15 minutos, 3 o 4 veces por da. Limite el consumo de sal en su dieta.  Evite levantar objetos pesados, usar tacones altos y sintese derecha.  Descanse con las piernas elevadas si tiene calambres o dolor de cintura.  Visite a su dentista  si no lo ha hecho durante el embarazo. Use un cepillo de dientes blando para higienizarse los dientes. Use suavemente el hilo dental.  Puede tener sexo (relaciones sexuales) siempre que el mdico la autorice.  No viaje por largas distancias si puede evitarlo. Slo hgalo con la aprobacin de su mdico.  Haga el curso pre parto.  Practique conducir hasta el hospital.  Prepare el bolso que llevar.  Prepare la habitacin del beb.  Concurra a los controles mdicos. SOLICITE AYUDA SI:   No est segura si est en trabajo de parto o ha roto la bolsa de aguas.  Tiene mareos.  Siente clicos intensos o presin en la zona baja del vientre (abdomen).  Siente un dolor persistente en la zona del vientre.  Tiene malestar estomacal (nuseas), devuelve (vomita), o tiene deposiciones acuosas (diarrea).  Advierte un olor ftido que proviene de la vagina.  Siente dolor al hacer pis (orinar). SOLICITE AYUDA DE INMEDIATO SI:   Tiene fiebre.  Pierde lquido o sangre por la vagina.  Tiene sangrando o pequeas prdidas vaginales.  Siente dolor intenso o clicos en el abdomen.  Sube o baja de peso rpidamente.  Tiene dificultad para respirar o siente dolor en el pecho.  Sbitamente se le hinchan el rostro, las manos, los tobillos, los pies o las piernas.  No ha sentido los movimientos del beb durante una hora.  Siente un dolor de cabeza intenso que no se alivia con medicamentos.  Su visin se modifica. Document   Released: 08/19/2013 ExitCare Patient Information 2015 [employer]. This information is not intended to replace advice given to you by your health care provider. Make sure you discuss any questions you have with your health care provider.  

## 2015-03-01 ENCOUNTER — Encounter: Payer: Self-pay | Admitting: General Practice

## 2015-03-04 ENCOUNTER — Ambulatory Visit (INDEPENDENT_AMBULATORY_CARE_PROVIDER_SITE_OTHER): Payer: Self-pay | Admitting: *Deleted

## 2015-03-04 VITALS — BP 120/62 | HR 87

## 2015-03-04 DIAGNOSIS — O24419 Gestational diabetes mellitus in pregnancy, unspecified control: Secondary | ICD-10-CM

## 2015-03-04 NOTE — Progress Notes (Signed)
US for growth scheduled 03/21/15 @ 0815.

## 2015-03-07 ENCOUNTER — Ambulatory Visit (INDEPENDENT_AMBULATORY_CARE_PROVIDER_SITE_OTHER): Payer: Self-pay | Admitting: Obstetrics and Gynecology

## 2015-03-07 ENCOUNTER — Other Ambulatory Visit: Payer: Self-pay | Admitting: Obstetrics and Gynecology

## 2015-03-07 ENCOUNTER — Encounter: Payer: Self-pay | Admitting: *Deleted

## 2015-03-07 ENCOUNTER — Encounter: Payer: Self-pay | Admitting: Obstetrics and Gynecology

## 2015-03-07 VITALS — BP 120/75 | HR 93 | Temp 98.4°F | Wt 148.0 lb

## 2015-03-07 DIAGNOSIS — Z113 Encounter for screening for infections with a predominantly sexual mode of transmission: Secondary | ICD-10-CM

## 2015-03-07 DIAGNOSIS — Z6741 Type O blood, Rh negative: Secondary | ICD-10-CM

## 2015-03-07 DIAGNOSIS — O0993 Supervision of high risk pregnancy, unspecified, third trimester: Secondary | ICD-10-CM

## 2015-03-07 DIAGNOSIS — O24419 Gestational diabetes mellitus in pregnancy, unspecified control: Secondary | ICD-10-CM

## 2015-03-07 DIAGNOSIS — O09293 Supervision of pregnancy with other poor reproductive or obstetric history, third trimester: Secondary | ICD-10-CM

## 2015-03-07 DIAGNOSIS — Z118 Encounter for screening for other infectious and parasitic diseases: Secondary | ICD-10-CM

## 2015-03-07 LAB — POCT URINALYSIS DIP (DEVICE)
Bilirubin Urine: NEGATIVE
GLUCOSE, UA: NEGATIVE mg/dL
Hgb urine dipstick: NEGATIVE
Ketones, ur: NEGATIVE mg/dL
NITRITE: NEGATIVE
Protein, ur: NEGATIVE mg/dL
Specific Gravity, Urine: <=1.005 (ref 1.005–1.030)
UROBILINOGEN UA: 0.2 mg/dL (ref 0.0–1.0)
pH: 5.5 (ref 5.0–8.0)

## 2015-03-07 LAB — US OB FOLLOW UP

## 2015-03-07 LAB — FETAL NONSTRESS TEST

## 2015-03-07 NOTE — Progress Notes (Signed)
Patient is doing well without complaints. FM/PTL precautions reviewed.  CBG reviewed and all within range with 2 values in 130's during lunch (patient admits to overeating) Discussed IOL at 39 weeks- answered all questions related to IOL Growth ultrasound scheduled for 3/21 Cultures today NST reviewed and reactive

## 2015-03-07 NOTE — Progress Notes (Signed)
Carol used for interpreter 

## 2015-03-08 LAB — GC/CHLAMYDIA PROBE AMP
CT Probe RNA: NEGATIVE
GC Probe RNA: NEGATIVE

## 2015-03-09 LAB — CULTURE, BETA STREP (GROUP B ONLY)

## 2015-03-11 ENCOUNTER — Ambulatory Visit (INDEPENDENT_AMBULATORY_CARE_PROVIDER_SITE_OTHER): Payer: Self-pay | Admitting: *Deleted

## 2015-03-11 VITALS — BP 124/74 | HR 81

## 2015-03-11 DIAGNOSIS — O24419 Gestational diabetes mellitus in pregnancy, unspecified control: Secondary | ICD-10-CM

## 2015-03-11 NOTE — Progress Notes (Signed)
NST reactive.

## 2015-03-14 ENCOUNTER — Ambulatory Visit (INDEPENDENT_AMBULATORY_CARE_PROVIDER_SITE_OTHER): Payer: Self-pay | Admitting: Obstetrics & Gynecology

## 2015-03-14 VITALS — BP 124/73 | HR 74 | Temp 98.6°F | Wt 151.1 lb

## 2015-03-14 DIAGNOSIS — O24419 Gestational diabetes mellitus in pregnancy, unspecified control: Secondary | ICD-10-CM

## 2015-03-14 DIAGNOSIS — O0993 Supervision of high risk pregnancy, unspecified, third trimester: Secondary | ICD-10-CM

## 2015-03-14 DIAGNOSIS — O360131 Maternal care for anti-D [Rh] antibodies, third trimester, fetus 1: Secondary | ICD-10-CM

## 2015-03-14 LAB — POCT URINALYSIS DIP (DEVICE)
BILIRUBIN URINE: NEGATIVE
Glucose, UA: NEGATIVE mg/dL
Ketones, ur: NEGATIVE mg/dL
Nitrite: NEGATIVE
PH: 5.5 (ref 5.0–8.0)
PROTEIN: NEGATIVE mg/dL
Specific Gravity, Urine: <=1.005 (ref 1.005–1.030)
Urobilinogen, UA: 0.2 mg/dL (ref 0.0–1.0)

## 2015-03-14 LAB — US OB FOLLOW UP

## 2015-03-14 NOTE — Progress Notes (Signed)
Blanca linder used as interpreter for this encounter.  

## 2015-03-14 NOTE — Progress Notes (Signed)
Patient is Spanish-speaking only, Spanish interpreter present for this encounter. Blood sugars are within range, continue Glyburide 2.5 mg po qam.  38 week growth scan already scheduled. NST performed today was reviewed and was found to be reactive.  AFI normal at 18.9 cm.  Continue recommended antenatal testing and prenatal care. No other complaints or concerns.  Labor and fetal movement precautions reviewed.

## 2015-03-14 NOTE — Patient Instructions (Signed)
Regrese a la clinica cuando tenga su cita. Si tiene problemas o preguntas, llama a la clinica o vaya a la sala de emergencia al [facility].    

## 2015-03-18 ENCOUNTER — Ambulatory Visit (INDEPENDENT_AMBULATORY_CARE_PROVIDER_SITE_OTHER): Payer: Self-pay | Admitting: *Deleted

## 2015-03-18 VITALS — BP 127/74 | HR 75

## 2015-03-18 DIAGNOSIS — O24419 Gestational diabetes mellitus in pregnancy, unspecified control: Secondary | ICD-10-CM

## 2015-03-20 ENCOUNTER — Encounter (HOSPITAL_COMMUNITY): Payer: Self-pay | Admitting: *Deleted

## 2015-03-20 ENCOUNTER — Inpatient Hospital Stay (HOSPITAL_COMMUNITY)
Admission: AD | Admit: 2015-03-20 | Discharge: 2015-03-22 | DRG: 775 | Disposition: A | Discharge status: Home / Self Care | Payer: Medicaid Other | Source: Ambulatory Visit | Attending: Obstetrics and Gynecology | Admitting: Obstetrics and Gynecology

## 2015-03-20 DIAGNOSIS — Z6791 Unspecified blood type, Rh negative: Secondary | ICD-10-CM | POA: Diagnosis not present

## 2015-03-20 DIAGNOSIS — O360131 Maternal care for anti-D [Rh] antibodies, third trimester, fetus 1: Secondary | ICD-10-CM

## 2015-03-20 DIAGNOSIS — Z833 Family history of diabetes mellitus: Secondary | ICD-10-CM

## 2015-03-20 DIAGNOSIS — O24429 Gestational diabetes mellitus in childbirth, unspecified control: Secondary | ICD-10-CM

## 2015-03-20 DIAGNOSIS — IMO0001 Reserved for inherently not codable concepts without codable children: Secondary | ICD-10-CM

## 2015-03-20 DIAGNOSIS — Z3A38 38 weeks gestation of pregnancy: Secondary | ICD-10-CM | POA: Diagnosis present

## 2015-03-20 DIAGNOSIS — O360930 Maternal care for other rhesus isoimmunization, third trimester, not applicable or unspecified: Secondary | ICD-10-CM

## 2015-03-20 DIAGNOSIS — O26893 Other specified pregnancy related conditions, third trimester: Secondary | ICD-10-CM | POA: Diagnosis present

## 2015-03-20 DIAGNOSIS — O09213 Supervision of pregnancy with history of pre-term labor, third trimester: Secondary | ICD-10-CM

## 2015-03-20 DIAGNOSIS — O09293 Supervision of pregnancy with other poor reproductive or obstetric history, third trimester: Secondary | ICD-10-CM

## 2015-03-20 HISTORY — DX: Type 2 diabetes mellitus without complications: E11.9

## 2015-03-20 LAB — TYPE AND SCREEN
ABO/RH(D): O NEG
Antibody Screen: NEGATIVE

## 2015-03-20 LAB — CBC
HCT: 36.6 % (ref 36.0–46.0)
Hemoglobin: 12.2 g/dL (ref 12.0–15.0)
MCH: 27.4 pg (ref 26.0–34.0)
MCHC: 33.3 g/dL (ref 30.0–36.0)
MCV: 82.1 fL (ref 78.0–100.0)
Platelets: 205 10*3/uL (ref 150–400)
RBC: 4.46 MIL/uL (ref 3.87–5.11)
RDW: 14.7 % (ref 11.5–15.5)
WBC: 12.8 10*3/uL — ABNORMAL HIGH (ref 4.0–10.5)

## 2015-03-20 LAB — AMNISURE RUPTURE OF MEMBRANE (ROM) NOT AT ARMC: Amnisure ROM: POSITIVE

## 2015-03-20 LAB — GLUCOSE, CAPILLARY: Glucose-Capillary: 73 mg/dL (ref 70–99)

## 2015-03-20 LAB — POCT FERN TEST

## 2015-03-20 MED ORDER — ONDANSETRON HCL 4 MG/2ML IJ SOLN: 4.0000 mg | Freq: Four times a day (QID) | INTRAMUSCULAR | Status: DC | PRN

## 2015-03-20 MED ORDER — OXYCODONE-ACETAMINOPHEN 5-325 MG PO TABS: 2.0000 | ORAL_TABLET | ORAL | Status: DC | PRN

## 2015-03-20 MED ORDER — WITCH HAZEL-GLYCERIN EX PADS: 1.0000 "application " | MEDICATED_PAD | CUTANEOUS | Status: DC | PRN

## 2015-03-20 MED ORDER — OXYCODONE-ACETAMINOPHEN 5-325 MG PO TABS: 1.0000 | ORAL_TABLET | ORAL | Status: DC | PRN

## 2015-03-20 MED ORDER — OXYTOCIN 40 UNITS IN LACTATED RINGERS INFUSION - SIMPLE MED
62.5000 mL/h | INTRAVENOUS | Status: DC
  Filled 2015-03-20: qty 1000

## 2015-03-20 MED ORDER — OXYTOCIN BOLUS FROM INFUSION
500.0000 mL | INTRAVENOUS | Status: DC
  Administered 2015-03-20: 500 mL via INTRAVENOUS

## 2015-03-20 MED ORDER — BENZOCAINE-MENTHOL 20-0.5 % EX AERO
1.0000 "application " | INHALATION_SPRAY | CUTANEOUS | Status: DC | PRN
  Administered 2015-03-21: 1 via TOPICAL
  Filled 2015-03-20: qty 56

## 2015-03-20 MED ORDER — ONDANSETRON HCL 4 MG PO TABS: 4.0000 mg | ORAL_TABLET | ORAL | Status: DC | PRN

## 2015-03-20 MED ORDER — ONDANSETRON HCL 4 MG/2ML IJ SOLN: 4.0000 mg | INTRAMUSCULAR | Status: DC | PRN

## 2015-03-20 MED ORDER — LACTATED RINGERS IV SOLN: 500.0000 mL | INTRAVENOUS | Status: DC | PRN

## 2015-03-20 MED ORDER — ACETAMINOPHEN 325 MG PO TABS: 650.0000 mg | ORAL_TABLET | ORAL | Status: DC | PRN

## 2015-03-20 MED ORDER — LANOLIN HYDROUS EX OINT: TOPICAL_OINTMENT | CUTANEOUS | Status: DC | PRN

## 2015-03-20 MED ORDER — CITRIC ACID-SODIUM CITRATE 334-500 MG/5ML PO SOLN: 30.0000 mL | ORAL | Status: DC | PRN

## 2015-03-20 MED ORDER — DIBUCAINE 1 % RE OINT: 1.0000 "application " | TOPICAL_OINTMENT | RECTAL | Status: DC | PRN

## 2015-03-20 MED ORDER — DIPHENHYDRAMINE HCL 25 MG PO CAPS: 25.0000 mg | ORAL_CAPSULE | Freq: Four times a day (QID) | ORAL | Status: DC | PRN

## 2015-03-20 MED ORDER — SIMETHICONE 80 MG PO CHEW: 80.0000 mg | CHEWABLE_TABLET | ORAL | Status: DC | PRN

## 2015-03-20 MED ORDER — TETANUS-DIPHTH-ACELL PERTUSSIS 5-2.5-18.5 LF-MCG/0.5 IM SUSP: 0.5000 mL | Freq: Once | INTRAMUSCULAR | Status: DC

## 2015-03-20 MED ORDER — LACTATED RINGERS IV SOLN
INTRAVENOUS | Status: DC
  Administered 2015-03-20: 18:00:00 via INTRAVENOUS

## 2015-03-20 MED ORDER — LIDOCAINE HCL (PF) 1 % IJ SOLN
30.0000 mL | INTRAMUSCULAR | Status: DC | PRN
  Filled 2015-03-20: qty 30

## 2015-03-20 MED ORDER — FLEET ENEMA 7-19 GM/118ML RE ENEM: 1.0000 | ENEMA | RECTAL | Status: DC | PRN

## 2015-03-20 MED ORDER — PRENATAL MULTIVITAMIN CH
1.0000 | ORAL_TABLET | Freq: Every day | ORAL | Status: DC
  Administered 2015-03-21 – 2015-03-22 (×2): 1 via ORAL
  Filled 2015-03-20 (×2): qty 1

## 2015-03-20 MED ORDER — ZOLPIDEM TARTRATE 5 MG PO TABS: 5.0000 mg | ORAL_TABLET | Freq: Every evening | ORAL | Status: DC | PRN

## 2015-03-20 MED ORDER — SENNOSIDES-DOCUSATE SODIUM 8.6-50 MG PO TABS
2.0000 | ORAL_TABLET | ORAL | Status: DC
  Administered 2015-03-21: 2 via ORAL
  Filled 2015-03-20 (×2): qty 2

## 2015-03-20 MED ORDER — IBUPROFEN 600 MG PO TABS
600.0000 mg | ORAL_TABLET | Freq: Four times a day (QID) | ORAL | Status: DC
  Administered 2015-03-21 – 2015-03-22 (×7): 600 mg via ORAL
  Filled 2015-03-20 (×7): qty 1

## 2015-03-20 NOTE — Progress Notes (Signed)
Assisted admissions with registration of patient. Spanish Interpreter

## 2015-03-20 NOTE — Progress Notes (Signed)
3/18 NST reviewed and reactive 

## 2015-03-20 NOTE — MAU Note (Signed)
C/o ? SROM @ 1500 this afternoon;

## 2015-03-20 NOTE — Progress Notes (Signed)
Checked on patient needs  °Spanish Interpreter °

## 2015-03-20 NOTE — Progress Notes (Signed)
Assisted Pharmacy tech with interpretation of questions concerning medications.  Spanish Interpreter

## 2015-03-20 NOTE — H&P (Signed)
Doris Bishop is a 29 y.o. [id number] female at [redacted]w[redacted]d by LMP presenting with SROM and contractions.   Reports active fetal movement, contractions: regular, every 5 minutes, vaginal bleeding: none, membranes: ruptured, clear fluid. Initiated prenatal care at [facility] at 15 wks.   Most recent u/s 02/14/2015. Pregnancy complicated by history of preeclampsia in prior pregnancies, Rh negative, and gestational diabetes.  H/O previous pre-term delivery after IOL for preeclampsia  Past Medical History: Past Medical History  Diagnosis Date  . Diabetes mellitus without complication     Past Surgical History: Past Surgical History  Procedure Laterality Date  . No past surgeries      Obstetrical History: OB History    Gravida Para Term Preterm AB TAB SAB Ectopic Multiple Living   0 0 0 0 0 2      Obstetric Comments   Spontaneous labor but diagnosed with preeclamspia      Social History: History   Social History  . Marital Status: Significant Other    Spouse Name: N/A  . Number of Children: N/A  . Years of Education: N/A   Social History Main Topics  . Smoking status: Never Smoker   . Smokeless tobacco: Never Used  . Alcohol Use: No  . Drug Use: No  . Sexual Activity: Yes    Birth Control/ Protection: None   Other Topics Concern  . None   Social History Narrative    Family History: Family History  Problem Relation Age of Onset  . Diabetes Father     Allergies: No Known Allergies  Prescriptions prior to admission  Medication Sig Dispense Refill Last Dose  . aspirin EC 81 MG tablet Take 1 tablet (81 mg total) by mouth daily. 200 tablet 4 03/19/2015 at Unknown time  . glyBURIDE (DIABETA) 5 MG tablet Take 5 mg by mouth at bedtime.   2 03/19/2015 at Unknown time  . Prenatal Vit-Fe Fumarate-FA (PRENATAL MULTIVITAMIN) TABS tablet Take 1 tablet by mouth daily at 12 noon.   03/19/2015 at Unknown time     Review of Systems  Pertinent pos/neg as indicated in  HPI    Blood pressure 128/75, pulse 89, temperature 98.4 F (36.9 C), temperature source Oral, resp. rate 16, height  (1.499 m), weight 68.947 kg (152 lb), last menstrual period 06/27/2014. General appearance: alert, cooperative, appears stated age and no distress Lungs: normal effort Heart: regular rate and rhythm Abdomen: gravid, soft, non-tender Extremities: no edema  Fetal monitoring: FHR: 140 bpm, variability: moderate,  Accelerations: Present,  decelerations:  Absent Uterine activity: q 5 min/mild  Dilation: 4 Effacement (%): 80 Station: -3, -2 Exam by:: Rosemarie Ax CNM Student Presentation: cephalic   Prenatal labs: ABO, Rh: O/Negative/-- (10/12 0000) Antibody: Negative (10/12 0000) Rubella:   RPR: Nonreactive (10/12 0000)  HBsAg: Negative (10/13 0000)  HIV: Non-reactive (10/13 0000)  GBS:     1 hr Glucola: 145, failed 3 hour, on Glyburide Genetic screening:  Normal quad screen Anatomy US: normal female  Results for orders placed or performed during the hospital encounter of 03/20/15 (from the past 24 hour(s))  [employer] Time: 03/20/15  4:37 PM  Result Value Ref Range   POCT Fern Test    Amnisure rupture of membrane (rom)   Collection Time: 03/20/15  4:47 PM  Result Value Ref Range   Amnisure ROM POSITIVE   Glucose, capillary   Collection Time: 03/20/15  6:04 PM  Result Value Ref Range  Glucose-Capillary 73 70 - 99 mg/dL   Comment 1 Notify RN    Comment 2 Document in Chart   CBC   Collection Time: 03/20/15  6:05 PM  Result Value Ref Range   WBC 12.8 (H) 4.0 - 10.5 K/uL   RBC 4.46 3.87 - 5.11 MIL/uL   Hemoglobin 12.2 12.0 - 15.0 g/dL   HCT 16.136.6 09.636.0 - 04.546.0 %   MCV 82.1 78.0 - 100.0 fL   MCH 27.4 26.0 - 34.0 pg   MCHC 33.3 30.0 - 36.0 g/dL   RDW 40.914.7 81.111.5 - 91.415.5 %   Platelets 205 150 - 400 K/uL     Assessment:  8548w0d SIUP  [id number]  SROM, early labor  Cat 1 FHR  GBS negative     Plan:  Admit to BS  IV pain meds/epidural prn  active labor  Expectant management  Anticipate NSVD   Plans to breast & bottle feed  Contraception: undecided  Circumcision: n/a  Mosetta Ferdinand Wynne SNM 03/20/2015, 6:35 PM

## 2015-03-20 NOTE — Progress Notes (Signed)
Assisted midwife with interpretation of patient assessment.  Spanish Interpreter

## 2015-03-20 NOTE — Progress Notes (Signed)
Assisted RN with interpretation of initial patient assessment.  Spanish Interpreter

## 2015-03-20 NOTE — Progress Notes (Signed)
Assisted RN with interpretation of admission to unit.  Also ordered patient snacks. Spanish Interpreter

## 2015-03-21 ENCOUNTER — Encounter (HOSPITAL_COMMUNITY): Payer: Self-pay | Admitting: *Deleted

## 2015-03-21 ENCOUNTER — Ambulatory Visit (HOSPITAL_COMMUNITY): Admission: RE | Admit: 2015-03-21 | Payer: Self-pay | Source: Ambulatory Visit

## 2015-03-21 ENCOUNTER — Other Ambulatory Visit: Payer: Self-pay

## 2015-03-21 DIAGNOSIS — O24419 Gestational diabetes mellitus in pregnancy, unspecified control: Secondary | ICD-10-CM

## 2015-03-21 LAB — RPR: RPR Ser Ql: NONREACTIVE

## 2015-03-21 LAB — CBC
HCT: 31.1 % — ABNORMAL LOW (ref 36.0–46.0)
HEMOGLOBIN: 10.4 g/dL — AB (ref 12.0–15.0)
MCH: 27.4 pg (ref 26.0–34.0)
MCHC: 33.4 g/dL (ref 30.0–36.0)
MCV: 82.1 fL (ref 78.0–100.0)
Platelets: 160 10*3/uL (ref 150–400)
RBC: 3.79 MIL/uL — AB (ref 3.87–5.11)
RDW: 14.5 % (ref 11.5–15.5)
WBC: 16.2 10*3/uL — ABNORMAL HIGH (ref 4.0–10.5)

## 2015-03-21 MED ORDER — RHO D IMMUNE GLOBULIN 1500 UNIT/2ML IJ SOSY
300.0000 ug | PREFILLED_SYRINGE | Freq: Once | INTRAMUSCULAR | Status: AC
  Administered 2015-03-21: 300 ug via INTRAVENOUS
  Filled 2015-03-21: qty 2

## 2015-03-21 NOTE — Progress Notes (Signed)
I ordered patient's dinner, snack and breakfast.

## 2015-03-21 NOTE — Progress Notes (Signed)
Interpreter used to explain the PKU test, the congenital heart screen, and the bilirubin test.

## 2015-03-21 NOTE — Progress Notes (Signed)
I ordered patient's lunch.  Doris Bishop  Interpreter. °

## 2015-03-21 NOTE — Progress Notes (Signed)
UR chart review completed.  

## 2015-03-21 NOTE — Lactation Note (Addendum)
This note was copied from the chart of Doris Alvine Bishop. Lactation Consultation Note  Jaci Lazieracifica Interpreter [phone]#[id number] and FOB spoke English too. Upon entering the MOB was giving baby formula.  She stated that she doesn't have enough milk and that the baby woke up during the night and was hungry so she started formula. Reviewed cluster feeding, supply and demand, volume guidelines. Mother states she always breastfeeds first and then gives formula. Mother denies other questions, problems or soreness.  Patient Name: Doris Bishop [id number]'s Date: 03/21/2015 Reason for consult: Follow-up assessment   Maternal Data    Feeding Feeding Type: Breast Milk with Formula added  LATCH Score/Interventions                      Lactation Tools Discussed/Used     Consult Status Consult Status: Follow-up Date: 03/22/15 Follow-up type: In-patient    [name], [name] 03/21/2015, 5:40 PM

## 2015-03-21 NOTE — Progress Notes (Signed)
I assisted Stormy,RN with questions. Eda H Royal  Interpreter.

## 2015-03-21 NOTE — Progress Notes (Signed)
Post Partum Day 1 Subjective: no complaints, up ad lib, voiding, tolerating PO and + flatus  Objective: Blood pressure 119/68, pulse 70, temperature 98.5 F (36.9 C), temperature source Oral, resp. rate 20, height 4\' 11"  (1.499 m), weight 152 lb (68.947 kg), last menstrual period 06/27/2014, unknown if currently breastfeeding.  Physical Exam:  General: alert, cooperative, appears stated age and no distress Lochia: appropriate Uterine Fundus: firm Incision: n/a DVT Evaluation: No evidence of DVT seen on physical exam. Negative Homan's sign. No cords or calf tenderness. No significant calf/ankle edema.   Recent Labs  03/20/15 1805 03/21/15 0610  HGB 12.2 10.4*  HCT 36.6 31.1*    Assessment/Plan: Plan for discharge tomorrow   LOS: 1 day   Anthonymichael Munday DARLENE 03/21/2015, 7:12 AM

## 2015-03-21 NOTE — Progress Notes (Signed)
I assisted Geologist, engineeringCaitlin RN with some questions, by Orlan LeavensViria Alvarez Interpreter

## 2015-03-21 NOTE — Lactation Note (Signed)
This note was copied from the chart of Doris Kabria Bishop. Lactation Consultation Note Interpreter at bedside translating. Experienced BF mom for 18 months 1st. Child, 4 months 2nd. Child. Denies challenges or painful latching.  Mom sitting on couch BF baby wrapped in blankets holding in cradle position. Encouraged to turn baby a little more facing mom. Encouraged STS. States baby latching and BF well. Heard intermittent swallows, mom sees colostrum.  Has good everted nipples. Mom encouraged to feed baby 8-12 times/24 hours and with feeding cues. Mom encouraged to waken baby for feeds. Mom reports + breast changes w/pregnancy.  Educated about newborn behavior. Referred to Baby and Me Book in Breastfeeding section Pg. 22-23 for position options and Proper latch demonstration.[facility] brochure given w/resources, support groups and LC services.Encouraged comfort during BF so colostrum flows better and mom will enjoy the feeding longer. Taking deep breaths and breast massage during BF.  Patient Name: Doris Wayna ChaletClaudia Bishop [id number]'XToday's Date: 03/21/2015 Reason for consult: Initial assessment   Maternal Data Does the patient have breastfeeding experience prior to this delivery?: Yes  Feeding Feeding Type: Bottle Fed - Formula Nipple Type: Slow - flow Length of feed: 5 min  LATCH Score/Interventions Latch: Grasps breast easily, tongue down, lips flanged, rhythmical sucking.  Audible Swallowing: A few with stimulation  Type of Nipple: Everted at rest and after stimulation  Comfort (Breast/Nipple): Soft / non-tender     Hold (Positioning): No assistance needed to correctly position infant at breast.  LATCH Score: 9  Lactation Tools Discussed/Used     Consult Status Consult Status: Follow-up Date: 03/22/15 Follow-up type: In-patient    Charyl DancerCARVER, Doris Bishop 03/21/2015, 3:39 AM

## 2015-03-22 LAB — RH IG WORKUP (INCLUDES ABO/RH)
ABO/RH(D): O NEG
FETAL SCREEN: NEGATIVE
Gestational Age(Wks): 38
Unit division: 0

## 2015-03-22 MED ORDER — NORETHINDRONE 0.35 MG PO TABS: 1.0000 | ORAL_TABLET | Freq: Every day | ORAL | Status: DC

## 2015-03-22 MED ORDER — IBUPROFEN 600 MG PO TABS
600.0000 mg | ORAL_TABLET | Freq: Four times a day (QID) | ORAL | Status: DC
Start: 2015-03-22 — End: 2016-06-25

## 2015-03-22 NOTE — Progress Notes (Signed)
I assisted Trisha,RN with discharge.  Eda H Royal  Interpreter.

## 2015-03-22 NOTE — Progress Notes (Signed)
I assisted Dr. Anyanwu with explanation of care plan. Eda H Royal  Interpreter. °

## 2015-03-22 NOTE — Discharge Instructions (Signed)

## 2015-03-22 NOTE — Discharge Summary (Signed)
Obstetric Discharge Summary Reason for Admission: onset of labor and rupture of membranes Prenatal Procedures: none Intrapartum Procedures: spontaneous vaginal delivery Postpartum Procedures: none Complications-Operative and Postpartum: none  Delivery Note At 8:44 PM a healthy female was delivered via Vaginal, Spontaneous Delivery (Presentation: ; Occiput Anterior).  APGAR: 8, 10; weight 6 lb 10.8 oz (3028 g).   Placenta status: Intact, Spontaneous.  Cord: 3 vessels with the following complications: None.    Anesthesia: None  Episiotomy: None Lacerations: None Suture Repair: N/A Est. Blood Loss (mL): 150  Mom to postpartum.  Baby to Couplet care / Skin to Skin.  Hospital Course:  Active Problems:   Active labor   Doris Bishop is a 29 y.o. [id number] s/p SVD.  Patient admitted to L&D.  She has postpartum course that was uncomplicated including no problems with ambulating, PO intake, urination, pain, or bleeding. The pt feels ready to go home and  will be discharged with outpatient follow-up.   Today: No acute events overnight.  Pt denies problems with ambulating, voiding or po intake.  She denies nausea or vomiting.  Pain is well controlled.  She has had flatus. She has had bowel movement.  Lochia Minimal.  Plan for birth control is  oral progesterone-only contraceptive. Has tried OCPs and Depo provera in the past, but noted significant side effects. Does not wish to use IUD or Nexplanon for birth control. Discussed POPs and she would like to try these to see if these are better tolerated than OCPs. Method of Feeding: Breast/Bottle   H/H: Lab Results  Component Value Date/Time   HGB 10.4* 03/21/2015 06:10 AM   HGB 12.5 10/11/2014   HCT 31.1* 03/21/2015 06:10 AM   HCT 38 10/11/2014    Discharge Diagnoses: Term Pregnancy-delivered  Discharge Information: Date: 03/22/2015 Activity: pelvic rest Diet: routine  Medications: Ibuprofen Breast feeding:  Yes Condition:  stable Instructions: refer to handout Discharge to: home    Medication List    STOP taking these medications        glyBURIDE 5 MG tablet  Commonly known as:  DIABETA      TAKE these medications        aspirin EC 81 MG tablet  Take 1 tablet (81 mg total) by mouth daily.     ibuprofen 600 MG tablet  Commonly known as:  ADVIL,MOTRIN  Take 1 tablet (600 mg total) by mouth every 6 (six) hours.     norethindrone 0.35 MG tablet  Commonly known as:  MICRONOR,CAMILA,ERRIN  Take 1 tablet (0.35 mg total) by mouth daily.     prenatal multivitamin Tabs tablet  Take 1 tablet by mouth daily at 12 noon.       Follow-up Information    [facility]   Specialty:  Obstetrics and Gynecology   Why:  4-6 weeks or sooner if needed   Contact information:   [address] [place] [phone] [phone]     Araceli Boucheumley, [place] N, DO Family Medicine, PGY-1 03/22/2015,8:38 AM

## 2015-03-24 ENCOUNTER — Other Ambulatory Visit: Payer: Self-pay

## 2015-03-27 ENCOUNTER — Inpatient Hospital Stay (HOSPITAL_COMMUNITY): Admission: RE | Admit: 2015-03-27 | Payer: Self-pay | Source: Ambulatory Visit

## 2015-03-31 ENCOUNTER — Encounter: Payer: Self-pay | Admitting: Obstetrics & Gynecology

## 2015-04-25 ENCOUNTER — Ambulatory Visit (INDEPENDENT_AMBULATORY_CARE_PROVIDER_SITE_OTHER): Payer: Self-pay | Admitting: Obstetrics and Gynecology

## 2015-04-25 ENCOUNTER — Encounter: Payer: Self-pay | Admitting: Obstetrics and Gynecology

## 2015-04-25 NOTE — Patient Instructions (Signed)

## 2015-04-25 NOTE — Progress Notes (Signed)
  Subjective:     Doris Bishop is a 29 y.o. female  [id number] who presents for a postpartum visit. She is 5 weeks postpartum following a spontaneous vaginal delivery. I have fully reviewed the prenatal and intrapartum course. The delivery was at 39 gestational weeks. Outcome: spontaneous vaginal delivery. Anesthesia: epidural. Postpartum course has been uncomplicated. Baby's course has been uncomplicated. Baby is feeding by breast and bottle, some formula. .Bleeding no bleeding. Bowel function is normal. Bladder function is normal. Patient is not sexually active. Contraception method is oral progesterone-only contraceptive. Postpartum depression screening: negative.  The following portions of the patient's history were reviewed and updated as appropriate: allergies, current medications, past family history, past medical history, past social history, past surgical history and problem list.  Review of Systems Pertinent items are noted in HPI.   Objective:    BP 113/63 mmHg  Pulse 79  Temp(Src) 99.3 F (37.4 C)  Wt 130 lb 12.8 oz (59.33 kg)  Breastfeeding? Yes  General:  alert and no distress   Breasts:  inspection negative, no nipple discharge or bleeding, no masses or nodularity palpable  Lungs: clear to auscultation bilaterally  Heart:  regular rate and rhythm, S1, S2 normal, no murmur, click, rub or gallop  Abdomen: soft, non-tender; bowel sounds normal; no masses,  no organomegaly well involuted, sm diastasis   Vulva:  not evaluated  Vagina: not evaluated  Cervix:  not evaluated  Corpus: not examined  Adnexa:  not evaluated  Rectal Exam: Not performed.        Assessment:     5wk postpartum exam. Pap smear not done at today's visit.   Plan:    1. Contraception: oral progesterone-only contraceptive 2. Needs to have 2hr OGTT after 6 wks. 3. Follow up in: 4 weeks or as needed.   Continue PNV 1/d through 6 wks.

## 2015-04-25 NOTE — Progress Notes (Deleted)
Patient ID: Doris Bishop, female   DOB: [birth year], 29 y.o.   MRN: [id number] Subjective:     Doris Bishop is a 29 y.o. female who presents for a postpartum visit. She is 5 weeks postpartum following a spontaneous vaginal delivery. I have fully reviewed the prenatal and intrapartum course. The delivery was at 38 gestational weeks. Outcome: spontaneous vaginal delivery. Anesthesia: none. Postpartum course has been ***. Baby's course has been ***. Baby is feeding by both breast and bottle - Similac Sensitive RS. Bleeding no bleeding. Bowel function is normal. Bladder function is normal. Patient is not sexually active. Contraception method is OCP (estrogen/progesterone). Postpartum depression screening: negative, 0  Spanish Interpreter: Alviris Almonte  Pt desires to take birth control pills  {Common ambulatory SmartLinks:19316}  Review of Systems {ros; complete:30496}   Objective:    There were no vitals taken for this visit.  General:  {gen appearance:16600}   Breasts:  {breast exam:1202::"inspection negative, no nipple discharge or bleeding, no masses or nodularity palpable"}  Lungs: {lung exam:16931}  Heart:  {heart exam:5510}  Abdomen: {abdomen exam:16834}   Vulva:  {labia exam:12198}  Vagina: {vagina exam:12200}  Cervix:  {cervix exam:14595}  Corpus: {uterus exam:12215}  Adnexa:  {adnexa exam:12223}  Rectal Exam: {rectal/vaginal exam:12274}        Assessment:    *** postpartum exam. Pap smear {done:10129} at today's visit.   Plan:    1. Contraception: {method:5051} 2. *** 3. Follow up in: {1-10:13787} {time; units:19136} or as needed.

## 2015-04-27 ENCOUNTER — Encounter: Payer: Self-pay | Admitting: Obstetrics and Gynecology

## 2015-04-27 NOTE — Progress Notes (Deleted)
  Subjective:     Doris Bishop is a 29 y.o. female who presents for a postpartum visit. She is {1-10:13787} {time; units:18646} postpartum following a {delivery:12449}. I have fully reviewed the prenatal and intrapartum course. The delivery was at *** gestational weeks. Outcome: {delivery outcome:32078}. Anesthesia: {anesthesia types:812}. Postpartum course has been ***. Baby's course has been ***. Baby is feeding by {breast/bottle:69}. Bleeding {vag bleed:12292}. Bowel function is {normal:32111}. Bladder function is {normal:32111}. Patient {is/is not:9024} sexually active. Contraception method is {contraceptive method:5051}. Postpartum depression screening: {neg default:13464::"negative"}.  {Common ambulatory SmartLinks:19316}  Review of Systems {ros; complete:30496}   Objective:    BP 113/63 mmHg  Pulse 79  Temp(Src) 99.3 F (37.4 C)  Wt 130 lb 12.8 oz (59.33 kg)  Breastfeeding? Yes  General:  {gen appearance:16600}   Breasts:  {breast exam:1202::"inspection negative, no nipple discharge or bleeding, no masses or nodularity palpable"}  Lungs: {lung exam:16931}  Heart:  {heart exam:5510}  Abdomen: {abdomen exam:16834}   Vulva:  {labia exam:12198}  Vagina: {vagina exam:12200}  Cervix:  {cervix exam:14595}  Corpus: {uterus exam:12215}  Adnexa:  {adnexa exam:12223}  Rectal Exam: {rectal/vaginal exam:12274}        Assessment:    *** postpartum exam. Pap smear {done:10129} at today's visit.   Plan:    1. Contraception: {method:5051} 2. *** 3. Follow up in: {1-10:13787} {time; units:19136} or as needed.

## 2016-01-01 NOTE — L&D Delivery Note (Signed)
Delivery Note At 8:38 PM a viable female was delivered via Vaginal, Spontaneous Delivery (OA ).  APGAR: 8, 9; weight Pending  .   Placenta status: intact, delivered [place]Shultz, . 3V Cord:  Anesthesia:  None Episiotomy: None Lacerations: None Suture Repair: None Est. Blood Loss (mL): 100  Mom to postpartum.  Baby to Couplet care / Skin to Skin.  Stacie Diette 08/15/2016, 8:51 PM  Patient is a [id number] at 1365w0d who was admitted for IOL due to A2GDM, significant hx of GDM.  She progressed with augmentation via foley, cytotec and AROM.  I was gloved and present for delivery in its entirety.  Second stage of labor progressed, baby delivered after a few contractions.  Mild decels during second stage noted.  Complications: none  Lacerations: none  EBL: 100cc  Cam HaiSHAW, KIMBERLY, CNM 4:01 AM  08/16/2016

## 2016-03-22 LAB — OB RESULTS CONSOLE GC/CHLAMYDIA
Chlamydia: NEGATIVE
GC PROBE AMP, GENITAL: NEGATIVE

## 2016-03-22 LAB — OB RESULTS CONSOLE ABO/RH: RH Type: NEGATIVE

## 2016-03-22 LAB — OB RESULTS CONSOLE HGB/HCT, BLOOD
HEMATOCRIT: 38 %
HEMOGLOBIN: 12.7 g/dL

## 2016-03-22 LAB — OB RESULTS CONSOLE HEPATITIS B SURFACE ANTIGEN: Hepatitis B Surface Ag: NEGATIVE

## 2016-03-22 LAB — OB RESULTS CONSOLE ANTIBODY SCREEN: ANTIBODY SCREEN: NEGATIVE

## 2016-03-22 LAB — OB RESULTS CONSOLE HIV ANTIBODY (ROUTINE TESTING): HIV: NONREACTIVE

## 2016-03-22 LAB — OB RESULTS CONSOLE RPR: RPR: NONREACTIVE

## 2016-03-22 LAB — OB RESULTS CONSOLE RUBELLA ANTIBODY, IGM: Rubella: IMMUNE

## 2016-03-22 LAB — OB RESULTS CONSOLE PLATELET COUNT: PLATELETS: 192 10*3/uL

## 2016-06-04 LAB — OB RESULTS CONSOLE HGB/HCT, BLOOD
HEMATOCRIT: 36 %
HEMOGLOBIN: 12.5 g/dL

## 2016-06-11 ENCOUNTER — Encounter: Payer: Self-pay | Attending: Obstetrics and Gynecology | Admitting: Dietician

## 2016-06-11 ENCOUNTER — Encounter: Payer: Self-pay | Admitting: *Deleted

## 2016-06-11 ENCOUNTER — Ambulatory Visit: Payer: Medicaid Other | Admitting: *Deleted

## 2016-06-11 DIAGNOSIS — Z029 Encounter for administrative examinations, unspecified: Secondary | ICD-10-CM | POA: Insufficient documentation

## 2016-06-11 DIAGNOSIS — O24419 Gestational diabetes mellitus in pregnancy, unspecified control: Secondary | ICD-10-CM

## 2016-06-11 NOTE — Progress Notes (Signed)
Diabetes Educator: 06/11/16 Doris Bishop was seen today for GDM  Notes she has no previous hx of GDM.  Has 3 boys, the youngest is 14 months..  Has possible family history of DM.She currently is 29 weeks. Completed the education session using the [employer] Spanish interpreter. Completed a review of GDM and its implications for the health of the mother and the baby. Completed review of post delivery self-care measures to prevent developing Type 2 DM. Completed review of need for exercise/walking to help lower glucose levels. Recommended 30 minutes daily in the cooler part of the day. Review of blood glucose testing.  Provided a True Track meter, box of strips, box of lancets. Review of the goals for fasting and 2 hour post meal glucose levels. Her blood glucose on doing a return demonstration was 111 mg/dl at 16:1012:30 pm.   Instructed to record glucose reading and to bring her meter and glucose log to all clinic appointments. Completed a brief review of the CHO restricted diet and eating pattern for GDM. Provided handout "Nutrition, Diabetes, and Pregnancy" in Spanish. She had some questions concerning the portion sizes. She will need to see Nutrition at her next visit. Maggie Couper Juncaj, RN, RD, LDN

## 2016-06-12 ENCOUNTER — Encounter: Payer: Self-pay | Admitting: *Deleted

## 2016-06-12 DIAGNOSIS — O24419 Gestational diabetes mellitus in pregnancy, unspecified control: Secondary | ICD-10-CM | POA: Insufficient documentation

## 2016-06-12 DIAGNOSIS — O0990 Supervision of high risk pregnancy, unspecified, unspecified trimester: Secondary | ICD-10-CM | POA: Insufficient documentation

## 2016-06-12 DIAGNOSIS — O09219 Supervision of pregnancy with history of pre-term labor, unspecified trimester: Secondary | ICD-10-CM

## 2016-06-12 DIAGNOSIS — O09899 Supervision of other high risk pregnancies, unspecified trimester: Secondary | ICD-10-CM

## 2016-06-12 LAB — CYTOLOGY - PAP: PAP SMEAR: NEGATIVE

## 2016-06-25 ENCOUNTER — Other Ambulatory Visit: Payer: Self-pay | Admitting: Obstetrics and Gynecology

## 2016-06-25 ENCOUNTER — Ambulatory Visit (INDEPENDENT_AMBULATORY_CARE_PROVIDER_SITE_OTHER): Payer: Self-pay | Admitting: Obstetrics and Gynecology

## 2016-06-25 ENCOUNTER — Encounter: Payer: Self-pay | Admitting: Obstetrics and Gynecology

## 2016-06-25 ENCOUNTER — Other Ambulatory Visit: Payer: Self-pay

## 2016-06-25 VITALS — BP 121/62 | HR 93 | Temp 98.2°F | Wt 145.4 lb

## 2016-06-25 DIAGNOSIS — O0990 Supervision of high risk pregnancy, unspecified, unspecified trimester: Secondary | ICD-10-CM

## 2016-06-25 DIAGNOSIS — O24415 Gestational diabetes mellitus in pregnancy, controlled by oral hypoglycemic drugs: Secondary | ICD-10-CM

## 2016-06-25 DIAGNOSIS — Z6791 Unspecified blood type, Rh negative: Secondary | ICD-10-CM

## 2016-06-25 DIAGNOSIS — Z8759 Personal history of other complications of pregnancy, childbirth and the puerperium: Secondary | ICD-10-CM | POA: Insufficient documentation

## 2016-06-25 LAB — COMPREHENSIVE METABOLIC PANEL
ALK PHOS: 85 U/L (ref 33–115)
ALT: 7 U/L (ref 6–29)
AST: 15 U/L (ref 10–30)
Albumin: 3.7 g/dL (ref 3.6–5.1)
BILIRUBIN TOTAL: 0.4 mg/dL (ref 0.2–1.2)
BUN: 6 mg/dL — AB (ref 7–25)
CALCIUM: 9.1 mg/dL (ref 8.6–10.2)
CO2: 24 mmol/L (ref 20–31)
Chloride: 104 mmol/L (ref 98–110)
Creat: 0.35 mg/dL — ABNORMAL LOW (ref 0.50–1.10)
GLUCOSE: 91 mg/dL (ref 65–99)
Potassium: 4.1 mmol/L (ref 3.5–5.3)
SODIUM: 138 mmol/L (ref 135–146)
Total Protein: 6.6 g/dL (ref 6.1–8.1)

## 2016-06-25 LAB — CBC
HCT: 37 % (ref 35.0–45.0)
Hemoglobin: 12.8 g/dL (ref 11.7–15.5)
MCH: 30 pg (ref 27.0–33.0)
MCHC: 34.6 g/dL (ref 32.0–36.0)
MCV: 86.7 fL (ref 80.0–100.0)
MPV: 10.4 fL (ref 7.5–12.5)
PLATELETS: 230 10*3/uL (ref 140–400)
RBC: 4.27 MIL/uL (ref 3.80–5.10)
RDW: 13.4 % (ref 11.0–15.0)
WBC: 11.5 10*3/uL — AB (ref 3.8–10.8)

## 2016-06-25 LAB — POCT URINALYSIS DIP (DEVICE)
Bilirubin Urine: NEGATIVE
GLUCOSE, UA: NEGATIVE mg/dL
Hgb urine dipstick: NEGATIVE
KETONES UR: NEGATIVE mg/dL
NITRITE: NEGATIVE
PH: 7 (ref 5.0–8.0)
PROTEIN: NEGATIVE mg/dL
Specific Gravity, Urine: 1.01 (ref 1.005–1.030)
UROBILINOGEN UA: 0.2 mg/dL (ref 0.0–1.0)

## 2016-06-25 MED ORDER — GLYBURIDE 1.25 MG PO TABS: 1.2500 mg | ORAL_TABLET | Freq: Every day | ORAL | Status: DC

## 2016-06-25 MED ORDER — ACCU-CHEK FASTCLIX LANCETS MISC: 1.0000 | Freq: Four times a day (QID) | Status: DC

## 2016-06-25 MED ORDER — GLUCOSE BLOOD VI STRP: ORAL_STRIP | Status: DC

## 2016-06-25 NOTE — Progress Notes (Signed)
Here for first visit.Transferring from Health department. Given new pateint education booklets. Used Interpreter [employer]ur[employer] CompanyBlanca Lindner.

## 2016-06-25 NOTE — Addendum Note (Signed)
Addended by: Rockwell GermanyFIELDS, Jakelin Taussig A on: 06/25/2016 03:09 PM   Modules accepted: Orders

## 2016-06-25 NOTE — Progress Notes (Signed)
Subjective:  Doris Bishop is a 30 y.o. [phone] at 6560w5d being seen today for initial prenatal care.  She is currently monitored for the following issues for this high-risk pregnancy and has Language barrier; Supervision of high risk pregnancy, antepartum; Gestational diabetes mellitus (GDM), antepartum; H/O preterm delivery, currently pregnant; History of gestational hypertension; and RhD negative on her problem list.  Patient reports no complaints.  Contractions: Not present. Vag. Bleeding: None.  Movement: Present. Denies leaking of fluid.   fastings 90s to 100s. PPs majority less than 120.  The following portions of the patient's history were reviewed and updated as appropriate: allergies, current medications, past family history, past medical history, past social history, past surgical history and problem list. Problem list updated.  Objective:   Filed Vitals:   06/25/16 1022  BP: 121/62  Pulse: 93  Temp: 98.2 F (36.8 C)  Weight: 145 lb 6.4 oz (65.953 kg)    Fetal Status: Fetal Heart Rate (bpm): 157   Movement: Present     General:  Alert, oriented and cooperative. Patient is in no acute distress.  Skin: Skin is warm and dry. No rash noted.   Cardiovascular: Normal heart rate noted  Respiratory: Normal respiratory effort, no problems with respiration noted  Abdomen: Soft, gravid, appropriate for gestational age. Pain/Pressure: Present     Pelvic: Cervical exam deferred        Extremities: Normal range of motion.  Edema: None  Mental Status: Normal mood and affect. Normal behavior. Normal judgment and thought content.   Urinalysis:      Assessment and Plan:  Pregnancy: [id number] at 3560w5d  1. History of gestational hypertension - noted  3. RhD negative - received rhogam in 3rd tri  # A2gdm - starting glyburide 1.25 qhs - start antenatal testing next week - u/s for growth - upc, cmp, cbc today - nutrition today  Preterm labor symptoms and general  obstetric precautions including but not limited to vaginal bleeding, contractions, leaking of fluid and fetal movement were reviewed in detail with the patient. Please refer to After Visit Summary for other counseling recommendations.    Kathrynn RunningNoah Bedford Rylin Seavey, MD

## 2016-06-26 LAB — PROTEIN / CREATININE RATIO, URINE
CREATININE, URINE: 48 mg/dL (ref 20–320)
Protein Creatinine Ratio: 188 mg/g creat — ABNORMAL HIGH (ref 21–161)
TOTAL PROTEIN, URINE: 9 mg/dL (ref 5–24)

## 2016-06-26 LAB — TSH: TSH: 2.31 mIU/L

## 2016-06-28 ENCOUNTER — Ambulatory Visit (HOSPITAL_COMMUNITY)
Admission: RE | Admit: 2016-06-28 | Discharge: 2016-06-28 | Disposition: A | Discharge status: Home / Self Care | Payer: Self-pay | Source: Ambulatory Visit | Attending: Obstetrics and Gynecology | Admitting: Obstetrics and Gynecology

## 2016-06-28 DIAGNOSIS — O0993 Supervision of high risk pregnancy, unspecified, third trimester: Secondary | ICD-10-CM | POA: Insufficient documentation

## 2016-06-28 DIAGNOSIS — O24415 Gestational diabetes mellitus in pregnancy, controlled by oral hypoglycemic drugs: Secondary | ICD-10-CM | POA: Insufficient documentation

## 2016-06-28 DIAGNOSIS — Z8759 Personal history of other complications of pregnancy, childbirth and the puerperium: Secondary | ICD-10-CM | POA: Insufficient documentation

## 2016-06-28 DIAGNOSIS — Z6791 Unspecified blood type, Rh negative: Secondary | ICD-10-CM | POA: Insufficient documentation

## 2016-06-28 DIAGNOSIS — O0990 Supervision of high risk pregnancy, unspecified, unspecified trimester: Secondary | ICD-10-CM

## 2016-06-28 DIAGNOSIS — Z3A32 32 weeks gestation of pregnancy: Secondary | ICD-10-CM | POA: Insufficient documentation

## 2016-07-02 ENCOUNTER — Other Ambulatory Visit: Payer: Self-pay

## 2016-07-02 ENCOUNTER — Ambulatory Visit (INDEPENDENT_AMBULATORY_CARE_PROVIDER_SITE_OTHER): Payer: Self-pay | Admitting: Obstetrics & Gynecology

## 2016-07-02 VITALS — BP 113/68 | HR 98

## 2016-07-02 DIAGNOSIS — O24415 Gestational diabetes mellitus in pregnancy, controlled by oral hypoglycemic drugs: Secondary | ICD-10-CM

## 2016-07-02 NOTE — Progress Notes (Signed)
NST 07/02/16 reactive

## 2016-07-05 ENCOUNTER — Other Ambulatory Visit: Payer: Self-pay

## 2016-07-09 ENCOUNTER — Ambulatory Visit (INDEPENDENT_AMBULATORY_CARE_PROVIDER_SITE_OTHER): Payer: Self-pay | Admitting: Obstetrics & Gynecology

## 2016-07-09 VITALS — BP 111/66 | HR 96 | Wt 145.9 lb

## 2016-07-09 DIAGNOSIS — O24415 Gestational diabetes mellitus in pregnancy, controlled by oral hypoglycemic drugs: Secondary | ICD-10-CM

## 2016-07-09 DIAGNOSIS — O133 Gestational [pregnancy-induced] hypertension without significant proteinuria, third trimester: Secondary | ICD-10-CM

## 2016-07-09 DIAGNOSIS — O161 Unspecified maternal hypertension, first trimester: Secondary | ICD-10-CM

## 2016-07-09 DIAGNOSIS — O24419 Gestational diabetes mellitus in pregnancy, unspecified control: Secondary | ICD-10-CM

## 2016-07-09 LAB — POCT URINALYSIS DIP (DEVICE)
Bilirubin Urine: NEGATIVE
GLUCOSE, UA: NEGATIVE mg/dL
Hgb urine dipstick: NEGATIVE
Ketones, ur: 40 mg/dL — AB
NITRITE: NEGATIVE
PROTEIN: NEGATIVE mg/dL
SPECIFIC GRAVITY, URINE: 1.015 (ref 1.005–1.030)
UROBILINOGEN UA: 0.2 mg/dL (ref 0.0–1.0)
pH: 5.5 (ref 5.0–8.0)

## 2016-07-09 MED ORDER — GLYBURIDE 2.5 MG PO TABS: 1.2500 mg | ORAL_TABLET | Freq: Every day | ORAL | Status: DC

## 2016-07-09 MED ORDER — ASPIRIN EC 81 MG PO TBEC: 81.0000 mg | DELAYED_RELEASE_TABLET | Freq: Every day | ORAL | Status: DC

## 2016-07-09 NOTE — Addendum Note (Signed)
Addended by: Jill SideAY, Shareef Eddinger L on: 07/09/2016 11:41 AM   Modules accepted: Level of Service

## 2016-07-09 NOTE — Progress Notes (Signed)
NST today reactive  Subjective:  Doris Bishop is a 30 y.o. [phone] at 7580w5d being seen today for ongoing prenatal care.  She is currently monitored for the following issues for this high-risk pregnancy and has Language barrier; Supervision of high risk pregnancy, antepartum; Gestational diabetes mellitus (GDM), antepartum; H/O preterm delivery, currently pregnant; History of gestational hypertension; and RhD negative on her problem list.  Patient reports no complaints.  Contractions: Irregular. Vag. Bleeding: None.  Movement: Present. Denies leaking of fluid.   The following portions of the patient's history were reviewed and updated as appropriate: allergies, current medications, past family history, past medical history, past social history, past surgical history and problem list. Problem list updated.  Objective:   Filed Vitals:   07/09/16 1634  BP: 111/66  Pulse: 96  Weight: 145 lb 14.4 oz (66.18 kg)    Fetal Status: Fetal Heart Rate (bpm): NST   Movement: Present     General:  Alert, oriented and cooperative. Patient is in no acute distress.  Skin: Skin is warm and dry. No rash noted.   Cardiovascular: Normal heart rate noted  Respiratory: Normal respiratory effort, no problems with respiration noted  Abdomen: Soft, gravid, appropriate for gestational age. Pain/Pressure: Present     Pelvic:  Cervical exam deferred        Extremities: Normal range of motion.  Edema: None  Mental Status: Normal mood and affect. Normal behavior. Normal judgment and thought content.   Urinalysis: Urine Protein: Negative Urine Glucose: Negative  Assessment and Plan:  Pregnancy: [id number] at 580w5d  1. Gestational diabetes mellitus (GDM) controlled on oral hypoglycemic drug, antepartum Reactive NST - Fetal nonstress test - glyBURIDE (DIABETA) 2.5 MG tablet; Take 0.5 tablets (1.25 mg total) by mouth at bedtime.  Dispense: 30 tablet; Refill: 2 FBS up to 100 and pp up to 120-40 2.  Gestational diabetes mellitus, currently pregnant Increased glyburi - aspirin EC 81 MG tablet; Take 1 tablet (81 mg total) by mouth daily.  Dispense: 200 tablet; Refill: 4  3. Elevated blood pressure affecting pregnancy in first trimester, antepartum BP normal - aspirin EC 81 MG tablet; Take 1 tablet (81 mg total) by mouth daily.  Dispense: 200 tablet; Refill: 4  Preterm labor symptoms and general obstetric precautions including but not limited to vaginal bleeding, contractions, leaking of fluid and fetal movement were reviewed in detail with the patient. Please refer to After Visit Summary for other counseling recommendations.  Return in about 3 days (around 07/12/2016) for NST/AFI only.   Adam PhenixJames G Draya Felker, MD

## 2016-07-09 NOTE — Progress Notes (Signed)
Pt states that no one has told her she needs to take ASA 81 mg daily.

## 2016-07-09 NOTE — Patient Instructions (Signed)
Tercer trimestre de embarazo (Third Trimester of Pregnancy) El tercer trimestre comprende desde la semana29 hasta la semana42, es decir, desde el mes7 hasta el mes9. El tercer trimestre es un perodo en el que el feto crece rpidamente. Hacia el final del noveno mes, el feto mide alrededor de 20pulgadas (45cm) de largo y pesa entre 6 y 10 libras (2,700 y 4,500kg).  CAMBIOS EN EL ORGANISMO Su organismo atraviesa por muchos cambios durante el embarazo, y estos varan de una mujer a otra.   Seguir aumentando de peso. Es de esperar que aumente entre 25 y 35libras (11 y 16kg) hacia el final del embarazo.  Podrn aparecer las primeras estras en las caderas, el abdomen y las mamas.  Puede tener necesidad de orinar con ms frecuencia porque el feto baja hacia la pelvis y ejerce presin sobre la vejiga.  Debido al embarazo podr sentir acidez estomacal con frecuencia.  Puede estar estreida, ya que ciertas hormonas enlentecen los movimientos de los msculos que empujan los desechos a travs de los intestinos.  Pueden aparecer hemorroides o abultarse e hincharse las venas (venas varicosas).  Puede sentir dolor plvico debido al aumento de peso y a que las hormonas del embarazo relajan las articulaciones entre los huesos de la pelvis. El dolor de espalda puede ser consecuencia de la sobrecarga de los msculos que soportan la postura.  Tal vez haya cambios en el cabello que pueden incluir su engrosamiento, crecimiento rpido y cambios en la textura. Adems, a algunas mujeres se les cae el cabello durante o despus del embarazo, o tienen el cabello seco o fino. Lo ms probable es que el cabello se le normalice despus del nacimiento del beb.  Las mamas seguirn creciendo y le dolern. A veces, puede haber una secrecin amarilla de las mamas llamada calostro.  El ombligo puede salir hacia afuera.  Puede sentir que le falta el aire debido a que se expande el tero.  Puede notar que el feto  "baja" o lo siente ms bajo, en el abdomen.  Puede tener una prdida de secrecin mucosa con sangre. Esto suele ocurrir en el trmino de unos pocos das a una semana antes de que comience el trabajo de parto.  El cuello del tero se vuelve delgado y blando (se borra) cerca de la fecha de parto. QU DEBE ESPERAR EN LOS EXMENES PRENATALES  Le harn exmenes prenatales cada 2semanas hasta la semana36. A partir de ese momento le harn exmenes semanales. Durante una visita prenatal de rutina:  La pesarn para asegurarse de que usted y el feto estn creciendo normalmente.  Le tomarn la presin arterial.  Le medirn el abdomen para controlar el desarrollo del beb.  Se escucharn los latidos cardacos fetales.  Se evaluarn los resultados de los estudios solicitados en visitas anteriores.  Le revisarn el cuello del tero cuando est prxima la fecha de parto para controlar si este se ha borrado. Alrededor de la semana36, el mdico le revisar el cuello del tero. Al mismo tiempo, realizar un anlisis de las secreciones del tejido vaginal. Este examen es para determinar si hay un tipo de bacteria, estreptococo Grupo B. El mdico le explicar esto con ms detalle. El mdico puede preguntarle lo siguiente:  Cmo le gustara que fuera el parto.  Cmo se siente.  Si siente los movimientos del beb.  Si ha tenido sntomas anormales, como prdida de lquido, sangrado, dolores de cabeza intensos o clicos abdominales.  Si est consumiendo algn producto que contenga tabaco, como cigarrillos, tabaco   de mascar y cigarrillos electrnicos.  Si tiene alguna pregunta. Otros exmenes o estudios de deteccin que pueden realizarse durante el tercer trimestre incluyen lo siguiente:  Anlisis de sangre para controlar los niveles de hierro (anemia).  Controles fetales para determinar su salud, nivel de actividad y crecimiento. Si tiene alguna enfermedad o hay problemas durante el embarazo, le harn  estudios.  Prueba del VIH (virus de inmunodeficiencia humana). Si corre un riesgo alto, pueden realizarle una prueba de deteccin del VIH durante el tercer trimestre del embarazo. FALSO TRABAJO DE PARTO Es posible que sienta contracciones leves e irregulares que finalmente desaparecen. Se llaman contracciones de Braxton Hicks o falso trabajo de parto. Las contracciones pueden durar horas, das o incluso semanas, antes de que el verdadero trabajo de parto se inicie. Si las contracciones ocurren a intervalos regulares, se intensifican o se hacen dolorosas, lo mejor es que la revise el mdico.  SIGNOS DE TRABAJO DE PARTO   Clicos de tipo menstrual.  Contracciones cada 5minutos o menos.  Contracciones que comienzan en la parte superior del tero y se extienden hacia abajo, a la zona inferior del abdomen y la espalda.  Sensacin de mayor presin en la pelvis o dolor de espalda.  Una secrecin de mucosidad acuosa o con sangre que sale de la vagina. Si tiene alguno de estos signos antes de la semana37 del embarazo, llame a su mdico de inmediato. Debe concurrir al hospital para que la controlen inmediatamente. INSTRUCCIONES PARA EL CUIDADO EN EL HOGAR   Evite fumar, consumir hierbas, beber alcohol y tomar frmacos que no le hayan recetado. Estas sustancias qumicas afectan la formacin y el desarrollo del beb.  No consuma ningn producto que contenga tabaco, lo que incluye cigarrillos, tabaco de mascar y cigarrillos electrnicos. Si necesita ayuda para dejar de fumar, consulte al mdico. Puede recibir asesoramiento y otro tipo de recursos para dejar de fumar.  Siga las indicaciones del mdico en relacin con el uso de medicamentos. Durante el embarazo, hay medicamentos que son seguros de tomar y otros que no.  Haga ejercicio solamente como se lo haya indicado el mdico. Sentir clicos uterinos es un buen signo para detener la actividad fsica.  Contine comiendo alimentos sanos con  regularidad.  Use un sostn que le brinde buen soporte si le duelen las mamas.  No se d baos de inmersin en agua caliente, baos turcos ni saunas.  Use el cinturn de seguridad en todo momento mientras conduce.  No coma carne cruda ni queso sin cocinar; evite el contacto con las bandejas sanitarias de los gatos y la tierra que estos animales usan. Estos elementos contienen grmenes que pueden causar defectos congnitos en el beb.  Tome las vitaminas prenatales.  Tome entre 1500 y 2000mg de calcio diariamente comenzando en la semana20 del embarazo hasta el parto.  Si est estreida, pruebe un laxante suave (si el mdico lo autoriza). Consuma ms alimentos ricos en fibra, como vegetales y frutas frescos y cereales integrales. Beba gran cantidad de lquido para mantener la orina de tono claro o color amarillo plido.  Dese baos de asiento con agua tibia para aliviar el dolor o las molestias causadas por las hemorroides. Use una crema para las hemorroides si el mdico la autoriza.  Si tiene venas varicosas, use medias de descanso. Eleve los pies durante 15minutos, 3 o 4veces por da. Limite el consumo de sal en su dieta.  Evite levantar objetos pesados, use zapatos de tacones bajos y mantenga una buena postura.  Descanse   con las piernas elevadas si tiene calambres o dolor de cintura.  Visite a su dentista si no lo ha hecho durante el embarazo. Use un cepillo de dientes blando para higienizarse los dientes y psese el hilo dental con suavidad.  Puede seguir manteniendo relaciones sexuales, a menos que el mdico le indique lo contrario.  No haga viajes largos excepto que sea absolutamente necesario y solo con la autorizacin del mdico.  Tome clases prenatales para entender, practicar y hacer preguntas sobre el trabajo de parto y el parto.  Haga un ensayo de la partida al hospital.  Prepare el bolso que llevar al hospital.  Prepare la habitacin del beb.  Concurra a todas  las visitas prenatales segn las indicaciones de su mdico. SOLICITE ATENCIN MDICA SI:  No est segura de que est en trabajo de parto o de que ha roto la bolsa de las aguas.  Tiene mareos.  Siente clicos leves, presin en la pelvis o dolor persistente en el abdomen.  Tiene nuseas, vmitos o diarrea persistentes.  Observa una secrecin vaginal con mal olor.  Siente dolor al orinar. SOLICITE ATENCIN MDICA DE INMEDIATO SI:   Tiene fiebre.  Tiene una prdida de lquido por la vagina.  Tiene sangrado o pequeas prdidas vaginales.  Siente dolor intenso o clicos en el abdomen.  Sube o baja de peso rpidamente.  Tiene dificultad para respirar y siente dolor de pecho.  Sbitamente se le hinchan mucho el rostro, las manos, los tobillos, los pies o las piernas.  No ha sentido los movimientos del beb durante una hora.  Siente un dolor de cabeza intenso que no se alivia con medicamentos.  Su visin se modifica.   Esta informacin no tiene como fin reemplazar el consejo del mdico. Asegrese de hacerle al mdico cualquier pregunta que tenga.   Document Released: 09/26/2005 Document Revised: 01/07/2015 Elsevier Interactive Patient Education 2016 [employer]vier Inc.  

## 2016-07-12 ENCOUNTER — Encounter: Payer: Self-pay | Admitting: Obstetrics & Gynecology

## 2016-07-12 ENCOUNTER — Ambulatory Visit (INDEPENDENT_AMBULATORY_CARE_PROVIDER_SITE_OTHER): Payer: Self-pay | Admitting: Obstetrics & Gynecology

## 2016-07-12 VITALS — BP 113/64 | HR 95

## 2016-07-12 DIAGNOSIS — Z36 Encounter for antenatal screening of mother: Secondary | ICD-10-CM

## 2016-07-12 DIAGNOSIS — O24415 Gestational diabetes mellitus in pregnancy, controlled by oral hypoglycemic drugs: Secondary | ICD-10-CM

## 2016-07-12 NOTE — Progress Notes (Signed)
Pt not seen by provider today NST reviewed and reactive.  Mikka Kissner L. Harraway-Smith, M.D., [facility] CoreFACOG

## 2016-07-16 ENCOUNTER — Ambulatory Visit (INDEPENDENT_AMBULATORY_CARE_PROVIDER_SITE_OTHER): Payer: Self-pay | Admitting: Family Medicine

## 2016-07-16 VITALS — BP 117/65 | HR 96

## 2016-07-16 DIAGNOSIS — O24415 Gestational diabetes mellitus in pregnancy, controlled by oral hypoglycemic drugs: Secondary | ICD-10-CM

## 2016-07-16 NOTE — Progress Notes (Signed)
NST reviewed and reactive.  

## 2016-07-19 ENCOUNTER — Ambulatory Visit (INDEPENDENT_AMBULATORY_CARE_PROVIDER_SITE_OTHER): Payer: Self-pay | Admitting: Obstetrics and Gynecology

## 2016-07-19 VITALS — BP 115/68 | HR 99

## 2016-07-19 DIAGNOSIS — Z36 Encounter for antenatal screening of mother: Secondary | ICD-10-CM

## 2016-07-19 DIAGNOSIS — O320XX0 Maternal care for unstable lie, not applicable or unspecified: Secondary | ICD-10-CM

## 2016-07-19 DIAGNOSIS — O24419 Gestational diabetes mellitus in pregnancy, unspecified control: Secondary | ICD-10-CM

## 2016-07-19 NOTE — Progress Notes (Signed)
Frank breech presentation today - was vertex 1 week ago. Will re-evaluate @ next appt on 7/25.

## 2016-07-19 NOTE — Progress Notes (Signed)
NST Note Date: 07/19/2016 Gestational Age: 31/1 FHT: 135 baseline, +accels, no decel, mod var Toco: occasional UCs  A/P: rNST, normal AFI, breech. Continue plan of care. F/u 1wk AFI and d/w pt re: ECV at next AFI check if still breech.   Cornelia Copaharlie Wynona Duhamel, Jr MD [facility]ending [facility](Faculty Practice)

## 2016-07-24 ENCOUNTER — Ambulatory Visit (INDEPENDENT_AMBULATORY_CARE_PROVIDER_SITE_OTHER): Payer: Self-pay | Admitting: Student

## 2016-07-24 VITALS — BP 119/67 | HR 92 | Wt 147.5 lb

## 2016-07-24 DIAGNOSIS — Z113 Encounter for screening for infections with a predominantly sexual mode of transmission: Secondary | ICD-10-CM

## 2016-07-24 DIAGNOSIS — Z789 Other specified health status: Secondary | ICD-10-CM

## 2016-07-24 DIAGNOSIS — O320XX0 Maternal care for unstable lie, not applicable or unspecified: Secondary | ICD-10-CM

## 2016-07-24 DIAGNOSIS — O24415 Gestational diabetes mellitus in pregnancy, controlled by oral hypoglycemic drugs: Secondary | ICD-10-CM

## 2016-07-24 DIAGNOSIS — O0993 Supervision of high risk pregnancy, unspecified, third trimester: Secondary | ICD-10-CM

## 2016-07-24 LAB — POCT URINALYSIS DIP (DEVICE)
Bilirubin Urine: NEGATIVE
GLUCOSE, UA: NEGATIVE mg/dL
Hgb urine dipstick: NEGATIVE
KETONES UR: NEGATIVE mg/dL
Nitrite: NEGATIVE
PROTEIN: NEGATIVE mg/dL
Specific Gravity, Urine: 1.015 (ref 1.005–1.030)
Urobilinogen, UA: 0.2 mg/dL (ref 0.0–1.0)
pH: 6.5 (ref 5.0–8.0)

## 2016-07-24 NOTE — Progress Notes (Signed)
  Subjective:  Doris Bishop is a 30 y.o. [phone] at [redacted]w[redacted]d being seen today for ongoing prenatal care.  She is currently monitored for the following issues for this high-risk pregnancy and has Language barrier; Supervision of high risk pregnancy, antepartum; Gestational diabetes mellitus (GDM), antepartum; H/O preterm delivery, currently pregnant; History of gestational hypertension; RhD negative; and Unstable lie of fetus on her problem list.  Patient reports no complaints.  Contractions: Not present. Vag. Bleeding: None.  Movement: Present. Denies leaking of fluid.   The following portions of the patient's history were reviewed and updated as appropriate: allergies, current medications, past family history, past medical history, past social history, past surgical history and problem list. Problem list updated.  Objective:   Vitals:   07/24/16 0759  BP: 119/67  Pulse: 92  Weight: 147 lb 8 oz (66.9 kg)    Fetal Status: Fetal Heart Rate (bpm): NST Fundal Height: 37 cm Movement: Present     General:  Alert, oriented and cooperative. Patient is in no acute distress.  Skin: Skin is warm and dry. No rash noted.   Cardiovascular: Normal heart rate noted  Respiratory: Normal respiratory effort, no problems with respiration noted  Abdomen: Soft, gravid, appropriate for gestational age. Pain/Pressure: Absent     Pelvic:  Cervical exam deferred  Extremities: Normal range of motion.  Edema: None  Mental Status: Normal mood and affect. Normal behavior. Normal judgment and thought content.   Urinalysis:      Assessment and Plan:  Pregnancy: [id number] at [redacted]w[redacted]d  1. Gestational diabetes mellitus (GDM) controlled on oral hypoglycemic drug, antepartum -pt did not bring BS log with her today; discussed importance of bringing log to next visit -states she is taking her glyburide as prescribed - Fetal nonstress test - Culture, beta strep (group b only) - GC/Chlamydia probe amp ([facility])not at [facility]  2. Supervision of high risk pregnancy, antepartum, third trimester   3. Unstable lie of fetus, not applicable or unspecified fetus -will assess on Thursday  4. Language barrier -Spanish interpreter used  Preterm labor symptoms and general obstetric precautions including but not limited to vaginal bleeding, contractions, leaking of fluid and fetal movement were reviewed in detail with the patient. Please refer to After Visit Summary for other counseling recommendations.  Return in about 1 week (around 07/31/2016) for Ob fu and NST.   Judeth Horn, NP

## 2016-07-24 NOTE — Patient Instructions (Signed)
Tercer trimestre de embarazo  (Third Trimester of Pregnancy)  El tercer trimestre comprende desde la semana 29 hasta la semana 42, es decir, desde el mes 7 hasta el mes 9. En este trimestre, el feto crece muy rápido. Hacia el final del noveno mes, el feto mide alrededor de 20 pulgadas (45 cm) de largo y pesa entre 6 y 10 libras (2,700 y 4,500 kg).   CUIDADOS EN EL HOGAR   · No fume, no consuma hierbas ni beba alcohol. No tome fármacos que el médico no haya autorizado.  · No consuma ningún producto que contenga tabaco, lo que incluye cigarrillos, tabaco de mascar o cigarrillos electrónicos. Si necesita ayuda para dejar de fumar, consulte al médico. Puede recibir asesoramiento u otro tipo de apoyo para dejar de fumar.  · Tome los medicamentos solamente como se lo haya indicado el médico. Algunos medicamentos son seguros para tomar durante el embarazo y otros no lo son.  · Haga ejercicios solamente como se lo haya indicado el médico. Interrumpa la actividad física si comienza a tener calambres.  · Ingiera alimentos saludables de manera regular.  · Use un sostén que le brinde buen soporte si sus mamas están sensibles.  · No se dé baños de inmersión en agua caliente, baños turcos ni saunas.  · Colóquese el cinturón de seguridad cuando conduzca.  · No coma carne cruda ni queso sin cocinar; evite el contacto con las bandejas sanitarias de los gatos y la tierra que estos animales usan.  · Tome las vitaminas prenatales.  · Tome entre 1500 y 2000 mg de calcio diariamente comenzando en la semana 20 del embarazo hasta el parto.  · Pruebe tomar un medicamento que la ayude a defecar (un laxante suave) si el médico lo autoriza. Consuma más fibra, que se encuentra en las frutas y verduras frescas y los cereales integrales. Beba suficiente líquido para mantener el pis (orina) claro o de color amarillo pálido.  · Dese baños de asiento con agua tibia para aliviar el dolor o las molestias causadas por las hemorroides. Use una crema  para las hemorroides si el médico la autoriza.  · Si se le hinchan las venas (venas varicosas), use medias de descanso. Levante (eleve) los pies durante 15 minutos, 3 o 4 veces por día. Limite el consumo de sal en su dieta.  · No levante objetos pesados, use zapatos de tacones bajos y siéntese derecha.  · Descanse con las piernas elevadas si tiene calambres o dolor de cintura.  · Visite a su dentista si no lo ha hecho durante el embarazo. Use un cepillo de cerdas suaves para cepillarse los dientes. Pásese el hilo dental con suavidad.  · Puede seguir manteniendo relaciones sexuales, a menos que el médico le indique lo contrario.  · No haga viajes de larga distancia, excepto si es obligatorio y solamente con la aprobación del médico.  · Tome clases prenatales.  · Practique ir manejando al hospital.  · Prepare el bolso que llevará al hospital.  · Prepare la habitación del bebé.  · Concurra a los controles médicos.  SOLICITE AYUDA SI:  · No está segura de si está en trabajo de parto o si ha roto la bolsa de las aguas.  · Tiene mareos.  · Siente calambres leves o presión en la parte inferior del abdomen.  · Sufre un dolor persistente en el abdomen.  · Tiene malestar estomacal (náuseas), vómitos, o tiene deposiciones acuosas (diarrea).  · Advierte un olor fétido que proviene de la vagina.  · Siente dolor al orinar.  SOLICITE AYUDA DE INMEDIATO SI:   ·   Tiene fiebre.  · Tiene una pérdida de líquido por la vagina.  · Tiene sangrado o pequeñas pérdidas vaginales.  · Siente dolor intenso o cólicos en el abdomen.  · Sube o baja de peso rápidamente.  · Tiene dificultades para recuperar el aliento y siente dolor en el pecho.  · Súbitamente se le hinchan mucho el rostro, las manos, los tobillos, los pies o las piernas.  · No ha sentido los movimientos del bebé durante una hora.  · Siente un dolor de cabeza intenso que no se alivia con medicamentos.  · Su visión se modifica.     Esta información no tiene como fin reemplazar el  consejo del médico. Asegúrese de hacerle al médico cualquier pregunta que tenga.     Document Released: 08/19/2013 Document Revised: 01/07/2015  Elsevier Interactive Patient Education ©2016 Elsevier Inc.

## 2016-07-25 LAB — GC/CHLAMYDIA PROBE AMP (~~LOC~~) NOT AT ARMC
CHLAMYDIA, DNA PROBE: NEGATIVE
NEISSERIA GONORRHEA: NEGATIVE

## 2016-07-26 ENCOUNTER — Ambulatory Visit (INDEPENDENT_AMBULATORY_CARE_PROVIDER_SITE_OTHER): Payer: Self-pay | Admitting: Family Medicine

## 2016-07-26 VITALS — BP 116/66 | HR 93

## 2016-07-26 DIAGNOSIS — O24415 Gestational diabetes mellitus in pregnancy, controlled by oral hypoglycemic drugs: Secondary | ICD-10-CM

## 2016-07-26 LAB — CULTURE, BETA STREP (GROUP B ONLY)

## 2016-07-26 NOTE — Progress Notes (Signed)
NST reviewed and reactive.  

## 2016-07-31 ENCOUNTER — Ambulatory Visit (INDEPENDENT_AMBULATORY_CARE_PROVIDER_SITE_OTHER): Payer: Self-pay | Admitting: Family

## 2016-07-31 VITALS — BP 115/78 | HR 97 | Wt 149.0 lb

## 2016-07-31 DIAGNOSIS — O09893 Supervision of other high risk pregnancies, third trimester: Secondary | ICD-10-CM

## 2016-07-31 DIAGNOSIS — O24415 Gestational diabetes mellitus in pregnancy, controlled by oral hypoglycemic drugs: Secondary | ICD-10-CM

## 2016-07-31 DIAGNOSIS — O0993 Supervision of high risk pregnancy, unspecified, third trimester: Secondary | ICD-10-CM

## 2016-07-31 DIAGNOSIS — O24414 Gestational diabetes mellitus in pregnancy, insulin controlled: Secondary | ICD-10-CM

## 2016-07-31 DIAGNOSIS — Z789 Other specified health status: Secondary | ICD-10-CM

## 2016-07-31 DIAGNOSIS — O24419 Gestational diabetes mellitus in pregnancy, unspecified control: Secondary | ICD-10-CM

## 2016-07-31 LAB — POCT URINALYSIS DIP (DEVICE)
Bilirubin Urine: NEGATIVE
GLUCOSE, UA: NEGATIVE mg/dL
KETONES UR: NEGATIVE mg/dL
Nitrite: NEGATIVE
PH: 7 (ref 5.0–8.0)
PROTEIN: NEGATIVE mg/dL
Specific Gravity, Urine: 1.02 (ref 1.005–1.030)
UROBILINOGEN UA: 1 mg/dL (ref 0.0–1.0)

## 2016-07-31 MED ORDER — PRENATAL VITAMINS 0.8 MG PO TABS: 1.0000 | ORAL_TABLET | Freq: Every day | ORAL | 12 refills | Status: AC

## 2016-07-31 MED ORDER — PRENATAL VITAMINS 0.8 MG PO TABS: 1.0000 | ORAL_TABLET | Freq: Every day | ORAL | 12 refills | Status: DC

## 2016-07-31 NOTE — Patient Instructions (Signed)
Diabetes mellitus gestacional (Gestational Diabetes Mellitus) La diabetes mellitus gestacional, ms comnmente conocida como diabetes gestacional es un tipo de diabetes que desarrollan algunas mujeres durante el embarazo. En la diabetes gestacional, el pncreas no produce suficiente insulina (una hormona) o las clulas son menos sensibles a la insulina producida (resistencia a la insulina), o ambas cosas. Normalmente, la insulina mueve los azcares de los alimentos a las clulas de los tejidos. Las clulas de los tejidos utilizan los azcares para obtener energa. La falta de insulina o la falta de una respuesta normal a la insulina hace que el exceso de azcar se acumule en la sangre en lugar de penetrar en las clulas de los tejidos. Como resultado, se producen niveles altos de azcar en la sangre (hiperglucemia). El efecto de los niveles altos de azcar (glucosa) puede causar muchos problemas.  FACTORES DE RIESGO Usted tiene mayor probabilidad de desarrollar diabetes gestacional si tiene antecedentes familiares de diabetes y tambin si tiene uno o ms de los siguientes factores de riesgo:  ndice de masa corporal superior a 30 (obesidad).  Embarazo previo con diabetes gestacional.  La edad avanzada en el momento del embarazo. Si se mantienen los niveles de glucosa en la sangre en un rango normal durante el embarazo, las mujeres pueden tener un embarazo saludable. Si los niveles de glucosa en la sangre no estn bien controlados, puede haber riesgos para usted, el feto o el recin nacido, o durante el trabajo de parto y el parto.  SNTOMAS  Si se presentan sntomas, stos son similares a los sntomas que normalmente experimentar durante el embarazo. Los sntomas de la diabetes gestacional son:   Aumento de la sed (polidipsia).  Aumento de la miccin (poliuria).  Orina con ms frecuencia durante la noche (nocturia).  Prdida de peso. La prdida de peso puede ser muy rpida.  Infecciones  frecuentes y recurrentes.  Cansancio (fatiga).  Debilidad.  Cambios en la visin, como visin borrosa.  Olor a fruta en el aliento.  Dolor abdominal. DIAGNSTICO La diabetes se diagnostica cuando hay aumento de los niveles de glucosa en la sangre. El nivel de glucosa en la sangre puede controlarse en uno o ms de los siguientes anlisis de sangre:  Medicin de glucosa en la sangre en ayunas. No se le permitir comer durante al menos 8 horas antes de que se tome una muestra de sangre.  Pruebas al azar de glucosa en la sangre. El nivel de glucosa en la sangre se controla en cualquier momento del da sin importar el momento en que haya comido.  Prueba de tolerancia a la glucosa oral (PTGO). La glucosa en la sangre se mide despus de no haber comido (ayunas) durante una a tres horas y despus de beber una bebida que contenga glucosa. Dado que las hormonas que causan la resistencia a la insulina son ms altas alrededor de las semanas 24 a 28 de embarazo, generalmente se realiza una PTGO durante ese tiempo. Si tiene factores de riesgo, en la primera visita prenatal pueden hacerle pruebas de deteccin de diabetes tipo 2 no diagnosticada. TRATAMIENTO  La diabetes gestacional debe controlarse en primer lugar con dieta y ejercicios. Pueden agregarse medicamentos, pero solo si son necesarios.  Usted tendr que tomar medicamentos para la diabetes o insulina diariamente para mantener los niveles de glucosa en la sangre en el rango deseado.  Usted tendr que combinar la dosis de insulina con la actividad fsica y la eleccin de alimentos saludables. Si tiene diabetes gestacional, el objetivo del tratamiento ser   mantener los siguientes niveles sanguneos de glucosa:  Antes de las comidas (preprandial): valor de 95 mg/dl o inferior.  Despus de las comidas (posprandial):  Una hora despus de la comida: valor de 140 mg/dl o inferior.  Dos horas despus de la comida: valor de 120 mg/dl o  inferior. Si tiene diabetes tipo 1 o tipo 2 preexistente, el objetivo del tratamiento ser mantener los siguientes niveles sanguneos de glucosa:  Antes de las comidas, a la hora de acostarse y durante la noche: de 60 a 99 mg/dl.  Despus de las comidas: valor mximo de 100 a 129 mg/dl. INSTRUCCIONES PARA EL CUIDADO EN EL HOGAR   Controle su nivel de hemoglobina A1c dos veces al ao.  Contrlese a diario el nivel de glucosa en la sangre segn las indicaciones de su mdico. Es comn realizar controles frecuentes de la glucosa en la sangre.  Supervise las cetonas en la orina cuando est enferma y segn las indicaciones de su mdico.  Tome el medicamento para la diabetes y adminstrese insulina segn las indicaciones de su mdico para mantener el nivel de glucosa en la sangre en el rango deseado.  Nunca se quede sin medicamento para la diabetes o sin insulina. Es necesario que la reciba todos los das.  Ajuste la insulina segn la ingesta de hidratos de carbono. Los hidratos de carbono pueden aumentar los niveles de glucosa en la sangre, pero deben incluirse en su dieta. Los hidratos de carbono aportan vitaminas, minerales y fibra que son una parte esencial de una dieta saludable. Los hidratos de carbono se encuentran en frutas, verduras, cereales integrales, productos lcteos, legumbres y alimentos que contienen azcares aadidos.  Consuma alimentos saludables. Alterne 3 comidas con 3 colaciones.  Aumente de peso saludablemente. El aumento del peso total vara de acuerdo con el ndice de masa corporal que tena antes del embarazo ([facility]).  Lleve una tarjeta de alerta mdica o use una pulsera o medalla de alerta mdica.  Lleve con usted una colacin de 15gramos de hidratos de carbono en todo momento para controlar los niveles bajos de glucosa en la sangre (hipoglucemia). Algunos ejemplos de colaciones de 15gramos de hidratos de carbono son los siguientes:  Tabletas de glucosa, 3 o 4.  Gel  de glucosa, tubo de 15 gramos.  Pasas de uva, 2 cucharadas (24 g).  Caramelos de goma, 6.  Galletas de animales, 8.  Jugo de fruta, gaseosa comn, o leche descremada, 4 onzas (120 ml).  Pastillas de goma, 9.  Reconocer la hipoglucemia. Durante el embarazo la hipoglucemia se produce cuando hay niveles de glucosa en la sangre de 60 mg/dl o menos. El riesgo de hipoglucemia aumenta durante el ayuno o cuando se saltea las comidas, durante o despus de realizar ejercicio intenso y mientras duerme. Los sntomas de hipoglucemia son:  Temblores o sacudidas.  Disminucin de la capacidad de concentracin.  Sudoracin.  Aumento de la frecuencia cardaca.  Dolor de cabeza.  Sequedad en la boca.  Hambre.  Irritabilidad.  Ansiedad.  Sueo agitado.  Alteracin del habla o de la coordinacin.  Confusin.  Tratar la hipoglucemia rpidamente. Si usted est alerta y puede tragar con seguridad, siga la regla de 15/15 que consiste en:  Tome entre 15 y 20gramos de glucosa de accin rpida o carbohidratos. Las opciones de accin rpida son un gel de glucosa, tabletas de glucosa, o 4 onzas (120 ml) de jugo de frutas, gaseosa comn, o leche baja en grasa.  Compruebe su nivel de glucosa en la sangre   15 minutos despus de tomar la glucosa.  Tome entre 15 y 20 gramos ms de glucosa si el nivel de glucosa en la sangre todava es de 70mg/dl o inferior.  Ingiera una comida o una colacin en el lapso de 1 hora una vez que los niveles de glucosa en la sangre vuelven a la normalidad.  Est atento a la poliuria (miccin excesiva) y la polidipsia (sensacin de mucha sed), que son los primeros signos de la hiperglucemia. El reconocimiento temprano de la hiperglucemia permite un tratamiento oportuno. Trate la hiperglucemia segn le indic su mdico.  Haga actividad fsica por lo menos 30minutos al da o como lo indique su mdico. Se recomienda que 30 minutos despus de cada comida, realice diez minutos  de actividad fsica para controlar los niveles de glucosa postprandial en la sangre.  Ajuste su dosis de insulina y la ingesta de alimentos, segn sea necesario, si inicia un nuevo ejercicio o deporte.  Siga su plan para los das de enfermedad cuando no pueda comer o beber como de costumbre.  Evite el tabaco y el alcohol.  Concurra a todas las visitas de control como se lo haya indicado el mdico.  Siga el consejo del mdico respecto a los controles prenatales y posteriores al parto (postparto), las visitas, la planificacin de las comidas, el ejercicio, los medicamentos, las vitaminas, los anlisis de sangre, otras pruebas mdicas y actividades fsicas.  Realice diariamente el cuidado de la piel y de los pies. Examine su piel y los pies diariamente para ver si tiene cortes, moretones, enrojecimiento, problemas en las uas, sangrado, ampollas o llagas.  Cepllese los dientes y encas por lo menos dos veces al da y use hilo dental al menos una vez por da. Concurra regularmente a las visitas de control con el dentista.  Programe un examen de vista durante el primer trimestre de su embarazo o como lo indique su mdico.  Comparta su plan de control de diabetes en el trabajo o en la escuela.  Mantngase al da con las vacunas.  Aprenda a manejar el estrs.  Obtenga la mayor cantidad posible de informacin sobre la diabetes y solicite ayuda siempre que sea necesario.  Obtenga informacin sobre el amamantamiento y analice esta posibilidad.  Debe controlar el nivel de azcar en la sangre de 6a 12semanas despus del parto. Esto se hace con una prueba de tolerancia a la glucosa oral (PTGO). SOLICITE ATENCIN MDICA SI:   No puede comer alimentos o beber por ms de 6 horas.  Tuvo nuseas o ha vomitado durante ms de 6 horas.  Tiene un nivel de glucosa en la sangre de 200 mg/dl y cetonas en la orina.  Presenta algn cambio en el estado mental.  Desarrolla problemas de visin.  Sufre  un dolor persistente de cabeza.  Siente dolor o molestias en la parte superior del abdomen.  Desarrolla una enfermedad grave adicional.  Tuvo diarrea durante ms de 6 horas.  Ha estado enfermo o ha tenido fiebre durante un par de das y no mejora. SOLICITE ATENCIN MDICA DE INMEDIATO SI:   Tiene dificultad para respirar.  Ya no siente los movimientos del beb.  Est sangrando o tiene flujo vaginal.  Comienza a tener contracciones o trabajo de parto prematuro. ASEGRESE DE QUE:  Comprende estas instrucciones.  Controlar su afeccin.  Recibir ayuda de inmediato si no mejora o si empeora.   Esta informacin no tiene como fin reemplazar el consejo del mdico. Asegrese de hacerle al mdico cualquier pregunta que tenga.     Document Released: 09/26/2005 Document Revised: 01/07/2015 Elsevier Interactive Patient Education 2016 [employer].  Systems analyst trimestre de Psychiatrist (Third Trimester of Pregnancy) El tercer trimestre comprende desde la semana29 [employer] semana42, es decir, desde el mes7 hasta el [address] El tercer trimestre es un perodo en el que el feto crece rpidamente. Hacia el final del noveno mes, el feto mide alrededor de 20pulgadas (45cm) de largo y pesa entre 6 y 10 libras (2,700 y 76,500kg).  CAMBIOS EN EL ORGANISMO Su organismo atraviesa por muchos cambios durante el [place], y estos varan de Neomia Dear mujer a Educational psychologist.   Seguir [employer]. Es de esperar que aumente entre 25 y 35libras (11 y 16kg) hacia el final del Psychiatrist.  Podrn aparecer las primeras [employer] caderas, el abdomen y las [place].  Puede tener necesidad de Geographical information systems officer con ms frecuencia porque el feto baja hacia la pelvis y ejerce presin sobre la vejiga.  Debido al Vanetta Mulders podr sentir Anthoney Harada estomacal con frecuencia.  Puede estar estreida, ya que ciertas hormonas enlentecen los movimientos de los msculos que [employer] desechos a travs de los intestinos.  Pueden aparecer  hemorroides o abultarse e hincharse las venas (venas varicosas).  Puede sentir dolor plvico debido al [place] y a que las hormonas del Management consultant las articulaciones entre los huesos de la pelvis. El dolor de espalda puede ser consecuencia de la sobrecarga de los msculos que soportan la [place].  Tal vez haya cambios en el cabello que pueden incluir su engrosamiento, crecimiento rpido y cambios en la textura. Adems, a algunas mujeres se les cae el cabello durante o despus del embarazo, o tienen el cabello seco o fino. Lo ms probable es que el cabello se le normalice despus del nacimiento del beb.  Las [employer] seguirn creciendo y Development worker, community. A veces, puede haber una secrecin amarilla de las mamas llamada calostro.  El ombligo puede salir hacia afuera.  Puede sentir que le falta el aire debido a que se expande el tero.  Puede notar que el feto "baja" o lo siente ms bajo, en el abdomen.  Puede tener una prdida de secrecin mucosa con sangre. Esto suele ocurrir en el trmino de unos [address] a una semana antes de que comience el [place].  El cuello del tero se vuelve delgado y blando (se borra) cerca de la fecha de [place]. QU DEBE ESPERAR EN LOS EXMENES PRENATALES  Le harn exmenes prenatales cada 2semanas hasta la semana36. A partir de ese momento le harn exmenes semanales. Durante una visita prenatal de rutina:  La pesarn para asegurarse de que usted y el feto estn creciendo normalmente.  Le tomarn la presin arterial.  Le medirn el abdomen para controlar el desarrollo del beb.  Se escucharn los latidos cardacos fetales.  Se evaluarn los resultados de los estudios solicitados en visitas anteriores.  Le revisarn el cuello del tero cuando est prxima la fecha de parto para controlar si este se ha borrado. Alrededor de la semana36, el mdico le revisar el cuello del tero. Al mismo tiempo, realizar un anlisis de las secreciones del  tejido vaginal. Este examen es para determinar si hay un tipo de bacteria, estreptococo Grupo B. El mdico le explicar esto con ms detalle. El mdico puede preguntarle lo siguiente:  Cmo le gustara que fuera el [place].  Cmo se siente.  Si siente los movimientos del beb.  Si ha tenido [employer], como prdida de lquido, [place], dolores de cabeza intensos  o clicos abdominales.  Si est consumiendo algn producto que contenga tabaco, como cigarrillos, tabaco de Theatre managermascar y Administrator, Civil Servicecigarrillos electrnicos.  Si tiene Colgate-Palmolivealguna pregunta. Otros exmenes o estudios de deteccin que pueden realizarse durante el tercer trimestre incluyen lo siguiente:  Anlisis de sangre para controlar los niveles de hierro (anemia).  Controles fetales para determinar su salud, nivel de [place] Grenadinesactividad y Designer, jewellerycrecimiento. Si tiene [place]una enfermedad o hay problemas durante el embarazo, le harn estudios.  Prueba del VIH (virus de inmunodeficiencia humana). Si corre Chiropodistun riesgo alto, pueden realizarle una prueba de deteccin del VIH durante el tercer trimestre del embarazo. FALSO TRABAJO DE PARTO Es posible que sienta contracciones leves e irregulares que finalmente desaparecen. Se llaman contracciones de 1000 Pine StreetBraxton Hicks o falso trabajo de [place]parto. Las Fifth Third Bancorpcontracciones pueden durar horas, [address]das o incluso semanas, antes de que el verdadero trabajo de parto se inicie. Si las contracciones ocurren a intervalos regulares, se intensifican o se hacen dolorosas, lo mejor es que la revise el mdico.  SIGNOS DE TRABAJO DE PARTO   Clicos de tipo menstrual.  Contracciones cada 5minutos o menos.  Contracciones que comienzan en la parte superior del tero y se extienden hacia abajo, a la zona inferior del abdomen y la espalda.  Sensacin de mayor presin en la pelvis o dolor de espalda.  Una secrecin de mucosidad acuosa o con sangre que sale de la vagina. Si tiene alguno de estos signos antes de la semana37 del Psychiatristembarazo, llame a su  mdico de inmediato. Debe concurrir al hospital para que la controlen inmediatamente. INSTRUCCIONES PARA EL CUIDADO EN EL HOGAR   Evite fumar, consumir hierbas, beber alcohol y tomar frmacos que no le hayan recetado. Estas sustancias qumicas afectan la formacin y el desarrollo del beb.  No consuma ningn producto que contenga tabaco, lo que incluye cigarrillos, tabaco de Theatre managermascar y Administrator, Civil Servicecigarrillos electrnicos. Si necesita ayuda para dejar de fumar, consulte al American Expressmdico. Puede recibir asesoramiento y otro tipo de recursos para dejar de fumar.  Siga las indicaciones del mdico en relacin con el uso de medicamentos. Durante el embarazo, hay medicamentos que son seguros de tomar y otros que no.  Haga ejercicio solamente como se lo haya indicado el mdico. Sentir clicos uterinos es un buen signo para Restaurant manager, fast fooddetener la actividad fsica.  Contine comiendo alimentos sanos con regularidad.  Use un sostn que le brinde buen soporte si le Altria Groupduelen las mamas.  No se d baos de inmersin en agua caliente, baos turcos ni saunas.  Use el cinturn de seguridad en todo momento mientras conduce.  No coma carne cruda ni queso sin cocinar; evite el contacto con las bandejas sanitarias de los gatos y la tierra que estos animales usan. Estos elementos contienen grmenes que pueden causar defectos congnitos en el beb.  Tome las vitaminas prenatales.  Tome entre 1500 y 2000mg  de calcio diariamente comenzando en la semana20 del embarazo [place] Ranch Estateshasta el parto.  Si est estreida, pruebe un laxante suave (si el mdico lo autoriza). Consuma ms alimentos ricos en fibra, como vegetales y frutas frescos y Radiation protection practitionercereales integrales. Beba gran cantidad de lquido para mantener la orina de tono claro o color amarillo plido.  Dese baos de asiento con agua tibia para Engineer, materialsaliviar el dolor o las molestias causadas por las hemorroides. Use una crema para las hemorroides si el mdico la autoriza.  Si tiene venas varicosas, use medias de  descanso. Eleve los pies durante 15minutos, 3 o 4veces por da. Limite el consumo de sal en su dieta.  Evite  levantar objetos pesados, use zapatos de tacones bajos y mantenga una buena postura.  Descanse con las piernas elevadas si tiene calambres o dolor de cintura.  Visite a su dentista si no lo ha hecho durante el embarazo. Use un cepillo de dientes blando para higienizarse los dientes y psese el hilo dental con suavidad.  Puede seguir manteniendo relaciones sexuales, a menos que el mdico le indique lo contrario.  No haga viajes largos excepto que sea absolutamente necesario y solo con la autorizacin del mdico.  Tome clases prenatales para entender, practicar y hacer preguntas sobre el trabajo de parto y el parto.  Haga un ensayo de la partida al hospital.  Prepare el bolso que llevar al hospital.  Prepare la habitacin del beb.  Concurra a todas las visitas prenatales segn las indicaciones de su mdico. SOLICITE ATENCIN MDICA SI:  No est segura de que est en trabajo de parto o de que ha roto la bolsa de las aguas.  Tiene mareos.  Siente clicos leves, presin en la pelvis o dolor persistente en el abdomen.  Tiene nuseas, vmitos o diarrea persistentes.  Observa una secrecin vaginal con mal olor.  Siente dolor al orinar. SOLICITE ATENCIN MDICA DE INMEDIATO SI:   Tiene fiebre.  Tiene una prdida de lquido por la vagina.  Tiene sangrado o pequeas prdidas vaginales.  Siente dolor intenso o clicos en el abdomen.  Sube o baja de peso rpidamente.  Tiene dificultad para respirar y siente dolor de pecho.  Sbitamente se le hinchan mucho el rostro, las manos, los tobillos, los pies o las piernas.  No ha sentido los movimientos del beb durante una hora.  Siente un dolor de cabeza intenso que no se alivia con medicamentos.  Su visin se modifica.   Esta informacin no tiene como fin reemplazar el consejo del mdico. Asegrese de hacerle al mdico  cualquier pregunta que tenga.   Document Released: 09/26/2005 Document Revised: 01/07/2015 Elsevier Interactive Patient Education 2016 Elsevier Inc.  

## 2016-07-31 NOTE — Progress Notes (Signed)
Fasting PPB PPL PPD  65-94, 1/8 abnl 92-124, 1/8 abnl 101-154, 5/8 abnl 94-122, 1/8 abnl   Subjective:  Doris Bishop is a 30 y.o. [id number] at [redacted]w[redacted]d being seen today for ongoing prenatal care.  She is currently monitored for the following issues for this high-risk pregnancy and has Language barrier; Supervision of high risk pregnancy, antepartum; Gestational diabetes mellitus (GDM), antepartum; H/O preterm delivery, currently pregnant; History of gestational hypertension; RhD negative; and Unstable lie of fetus on her problem list.  Patient reports no complaints.  Contractions: Irritability. Vag. Bleeding: None.  Movement: Present. Denies leaking of fluid.   The following portions of the patient's history were reviewed and updated as appropriate: allergies, current medications, past family history, past medical history, past social history, past surgical history and problem list. Problem list updated.  Objective:   Vitals:   07/31/16 1502  BP: 115/78  Pulse: 97  Weight: 149 lb (67.6 kg)    Fetal Status: Fetal Heart Rate (bpm): NST-R Fundal Height: 35 cm Movement: Present     General:  Alert, oriented and cooperative. Patient is in no acute distress.  Skin: Skin is warm and dry. No rash noted.   Cardiovascular: Normal heart rate noted  Respiratory: Normal respiratory effort, no problems with respiration noted  Abdomen: Soft, gravid, appropriate for gestational age. Pain/Pressure: Present     Pelvic:  Cervical exam deferred        Extremities: Normal range of motion.  Edema: None  Mental Status: Normal mood and affect. Normal behavior. Normal judgment and thought content.   Urinalysis: Urine Protein: Trace Urine Glucose: Negative  Assessment and Plan:  Pregnancy: [id number] at [redacted]w[redacted]d  1.  Gestational diabetes mellitus (GDM) affecting pregnancy > Good Control - NST reactive - [place] MFM OB FOLLOW UP; Future  2. Supervision of other high-risk pregnancy, third trimester - GBS  results  3. Language barrier - Utilized video interpreter  Preterm labor symptoms and general obstetric precautions including but not limited to vaginal bleeding, contractions, leaking of fluid and fetal movement were reviewed in detail with the patient. Please refer to After Visit Summary for other counseling recommendations.  Return for NST 2/wk and appt weekly.   Eino Farber Kennith Gain, CNM

## 2016-07-31 NOTE — Progress Notes (Signed)
Video spanish interpreter [phone] Large leuks on ua

## 2016-08-02 ENCOUNTER — Ambulatory Visit (INDEPENDENT_AMBULATORY_CARE_PROVIDER_SITE_OTHER): Payer: Self-pay | Admitting: Obstetrics & Gynecology

## 2016-08-02 VITALS — BP 111/69 | HR 97

## 2016-08-02 DIAGNOSIS — O24414 Gestational diabetes mellitus in pregnancy, insulin controlled: Secondary | ICD-10-CM

## 2016-08-06 ENCOUNTER — Ambulatory Visit (INDEPENDENT_AMBULATORY_CARE_PROVIDER_SITE_OTHER): Payer: Self-pay | Admitting: Obstetrics & Gynecology

## 2016-08-06 VITALS — BP 123/65 | HR 91 | Wt 150.9 lb

## 2016-08-06 DIAGNOSIS — O24414 Gestational diabetes mellitus in pregnancy, insulin controlled: Secondary | ICD-10-CM

## 2016-08-06 DIAGNOSIS — Z8759 Personal history of other complications of pregnancy, childbirth and the puerperium: Secondary | ICD-10-CM

## 2016-08-06 DIAGNOSIS — O0993 Supervision of high risk pregnancy, unspecified, third trimester: Secondary | ICD-10-CM

## 2016-08-06 DIAGNOSIS — O320XX1 Maternal care for unstable lie, fetus 1: Secondary | ICD-10-CM

## 2016-08-06 DIAGNOSIS — Z603 Acculturation difficulty: Secondary | ICD-10-CM

## 2016-08-06 DIAGNOSIS — Z789 Other specified health status: Secondary | ICD-10-CM

## 2016-08-06 DIAGNOSIS — Z6791 Unspecified blood type, Rh negative: Secondary | ICD-10-CM

## 2016-08-06 DIAGNOSIS — O320XX0 Maternal care for unstable lie, not applicable or unspecified: Secondary | ICD-10-CM

## 2016-08-06 LAB — POCT URINALYSIS DIP (DEVICE)
BILIRUBIN URINE: NEGATIVE
Glucose, UA: NEGATIVE mg/dL
HGB URINE DIPSTICK: NEGATIVE
KETONES UR: NEGATIVE mg/dL
NITRITE: NEGATIVE
Protein, ur: NEGATIVE mg/dL
Specific Gravity, Urine: <=1.005 (ref 1.005–1.030)
Urobilinogen, UA: 0.2 mg/dL (ref 0.0–1.0)
pH: 5.5 (ref 5.0–8.0)

## 2016-08-06 NOTE — Progress Notes (Signed)
US for growth scheduled 8/15

## 2016-08-06 NOTE — Progress Notes (Signed)
Subjective:  Doris Bishop is a 30 y.o. [phone] at 5751w5d being seen today for ongoing prenatal care.  She is currently monitored for the following issues for this high-risk pregnancy and has Language barrier; Supervision of high risk pregnancy, antepartum; Gestational diabetes mellitus (GDM), antepartum; H/O preterm delivery, currently pregnant; History of gestational hypertension; RhD negative; and Unstable lie of fetus on her problem list.  Patient reports no complaints.  Contractions: Irregular. Vag. Bleeding: None.  Movement: Present. Denies leaking of fluid.   The following portions of the patient's history were reviewed and updated as appropriate: allergies, current medications, past family history, past medical history, past social history, past surgical history and problem list. Problem list updated.  Objective:   Vitals:   08/06/16 1521  BP: 123/65  Pulse: 91  Weight: 150 lb 14.4 oz (68.4 kg)    Fetal Status: Fetal Heart Rate (bpm): NST   Movement: Present     General:  Alert, oriented and cooperative. Patient is in no acute distress.  Skin: Skin is warm and dry. No rash noted.   Cardiovascular: Normal heart rate noted  Respiratory: Normal respiratory effort, no problems with respiration noted  Abdomen: Soft, gravid, appropriate for gestational age. Pain/Pressure: Present     Pelvic:  Cervical exam deferred        Extremities: Normal range of motion.  Edema: None  Mental Status: Normal mood and affect. Normal behavior. Normal judgment and thought content.   Urinalysis:      Assessment and Plan:  Pregnancy: [id number] at 4551w5d  1. Insulin controlled gestational diabetes mellitus during pregnancy, unspecified trimester  - Fetal nonstress test NST reviewed and reactive.  2. Supervision of high risk pregnancy, antepartum, third trimester  3. RhD negative S/p rhogam at 28 weeks  4. Language barrier Spanish  5. History of gestational hypertension BP normal  today No meds  6. Insulin controlled gestational diabetes mellitus (GDM) during pregnancy, antepartum maintained on glyburide 2.5mg  q HS  7. Unstable lie of fetus, fetus 1 resolved  Term labor symptoms and general obstetric precautions including but not limited to vaginal bleeding, contractions, leaking of fluid and fetal movement were reviewed in detail with the patient. Please refer to After Visit Summary for other counseling recommendations.  Return in about 3 days (around 08/09/2016) for 8/10 and 8/15 as scheduled.   Willodean Rosenthalarolyn Harraway-Smith, MD

## 2016-08-09 ENCOUNTER — Ambulatory Visit (INDEPENDENT_AMBULATORY_CARE_PROVIDER_SITE_OTHER): Payer: Self-pay | Admitting: *Deleted

## 2016-08-09 VITALS — BP 119/69 | HR 94

## 2016-08-09 DIAGNOSIS — Z36 Encounter for antenatal screening of mother: Secondary | ICD-10-CM

## 2016-08-09 DIAGNOSIS — O24414 Gestational diabetes mellitus in pregnancy, insulin controlled: Secondary | ICD-10-CM

## 2016-08-09 NOTE — Progress Notes (Signed)
NST performed today was reviewed and was found to be reactive.  AFI was also normal.  Continue recommended antenatal testing and prenatal care. 

## 2016-08-10 ENCOUNTER — Telehealth (HOSPITAL_COMMUNITY): Payer: Self-pay | Admitting: *Deleted

## 2016-08-10 NOTE — Telephone Encounter (Signed)
Preadmission screen Interpreter number [phone]

## 2016-08-14 ENCOUNTER — Other Ambulatory Visit: Payer: Self-pay | Admitting: Family

## 2016-08-14 ENCOUNTER — Ambulatory Visit (HOSPITAL_COMMUNITY)
Admission: RE | Admit: 2016-08-14 | Discharge: 2016-08-14 | Disposition: A | Discharge status: Home / Self Care | Payer: Self-pay | Source: Ambulatory Visit | Attending: Family | Admitting: Family

## 2016-08-14 ENCOUNTER — Encounter (HOSPITAL_COMMUNITY): Payer: Self-pay

## 2016-08-14 ENCOUNTER — Ambulatory Visit (INDEPENDENT_AMBULATORY_CARE_PROVIDER_SITE_OTHER): Payer: Self-pay | Admitting: Medical

## 2016-08-14 VITALS — BP 116/73 | HR 101 | Wt 151.0 lb

## 2016-08-14 DIAGNOSIS — O24419 Gestational diabetes mellitus in pregnancy, unspecified control: Secondary | ICD-10-CM

## 2016-08-14 DIAGNOSIS — Z3A38 38 weeks gestation of pregnancy: Secondary | ICD-10-CM

## 2016-08-14 DIAGNOSIS — O0993 Supervision of high risk pregnancy, unspecified, third trimester: Secondary | ICD-10-CM

## 2016-08-14 DIAGNOSIS — O24415 Gestational diabetes mellitus in pregnancy, controlled by oral hypoglycemic drugs: Secondary | ICD-10-CM

## 2016-08-14 DIAGNOSIS — O09219 Supervision of pregnancy with history of pre-term labor, unspecified trimester: Secondary | ICD-10-CM

## 2016-08-14 DIAGNOSIS — O24414 Gestational diabetes mellitus in pregnancy, insulin controlled: Secondary | ICD-10-CM

## 2016-08-14 DIAGNOSIS — O09899 Supervision of other high risk pregnancies, unspecified trimester: Secondary | ICD-10-CM

## 2016-08-14 LAB — POCT URINALYSIS DIP (DEVICE)
BILIRUBIN URINE: NEGATIVE
Glucose, UA: NEGATIVE mg/dL
KETONES UR: NEGATIVE mg/dL
NITRITE: NEGATIVE
PH: 7 (ref 5.0–8.0)
Protein, ur: NEGATIVE mg/dL
Specific Gravity, Urine: 1.01 (ref 1.005–1.030)
Urobilinogen, UA: 0.2 mg/dL (ref 0.0–1.0)

## 2016-08-14 NOTE — Progress Notes (Signed)
Subjective:  Doris Bishop is a 30 y.o. [phone] at 3371w6d being seen today for ongoing prenatal care.  She is currently monitored for the following issues for this high-risk pregnancy and has Language barrier; Supervision of high risk pregnancy, antepartum; Gestational diabetes mellitus (GDM), antepartum; H/O preterm delivery, currently pregnant; History of gestational hypertension; RhD negative; and Unstable lie of fetus on her problem list.  Patient reports no complaints.  Contractions: Irregular. Vag. Bleeding: None.  Movement: Present. Denies leaking of fluid.   The following portions of the patient's history were reviewed and updated as appropriate: allergies, current medications, past family history, past medical history, past social history, past surgical history and problem list. Problem list updated.  Objective:   Vitals:   08/14/16 1500  BP: 116/73  Pulse: (!) 101  Weight: 151 lb (68.5 kg)    Fetal Status: Fetal Heart Rate (bpm): NST Fundal Height: 39 cm Movement: Present     General:  Alert, oriented and cooperative. Patient is in no acute distress.  Skin: Skin is warm and dry. No rash noted.   Cardiovascular: Normal heart rate noted  Respiratory: Normal respiratory effort, no problems with respiration noted  Abdomen: Soft, gravid, appropriate for gestational age. Pain/Pressure: Present     Pelvic:  Cervical exam deferred        Extremities: Normal range of motion.  Edema: None  Mental Status: Normal mood and affect. Normal behavior. Normal judgment and thought content.   Urinalysis: Urine Protein: Negative Urine Glucose: Negative  Assessment and Plan:  Pregnancy: [id number] at 7671w6d  1. Gestational diabetes mellitus (GDM) in third trimester controlled on oral hypoglycemic drug - Fetal nonstress test - reactive today - Patient glucose log reviewed - good control - patient has F/U growth US today - patient scheduled for induction of labor on Wednesday at 7:00 am,  advised of plan  Term labor symptoms and general obstetric precautions including but not limited to vaginal bleeding, contractions, leaking of fluid and fetal movement were reviewed in detail with the patient. Please refer to After Visit Summary for other counseling recommendations.  For induction, as scheduled    Marny LowensteinJulie N Wenzel, PA-C

## 2016-08-14 NOTE — Patient Instructions (Signed)
Induccin del trabajo de parto  (Labor Induction) Se denomina induccin del trabajo de parto cuando se inician acciones para hacer que una mujer embarazada comience el trabajo de parto. La mayora de las mujeres comienzan el trabajo de parto sin ayuda entre las semanas 37 y 42 del embarazo. Cuando esto no ocurre o cuando hay una necesidad mdica, pueden utilizarse diferentes mtodos para inducirlo. La induccin del trabajo de parto hace que el tero se contraiga. Tambin hace que el cuello del tero se ablandemadure), se abra (se dilate), y se afine (se borre). Generalmente el trabajo de parto no se induce antes de las 39 semanas excepto que haya un problema con el beb o con la madre.  Antes de inducir el trabajo de parto, el mdico considerar cierto nmero de factores incluyendo los siguientes:  El estado del beb.  Cuntas semanas tiene de embarazo.  La madurez de los pulmones del beb.  El estado del cuello del tero.  La posicin del beb. CULES SON LOS MOTIVOS PARA INDUCIR UN PARTO? El trabajo de parto puede inducirse por las siguientes razones:  La salud del beb o de la madre estn en riesgo.  El embarazo se ha pasado de trmino en 1 semana o ms.  Ha roto la bolsa de aguas pero no se ha iniciado el trabajo de parto por s mismo.  La madre tiene algn trastorno de salud o una enfermedad grave, como hipertensin arterial, una infeccin, desprendimiento abrupto de la placenta o diabetes.  Hay escaso lquido amnitico alrededor del beb.  El beb presenta sufrimiento. La conveniencia o el deseo de que el beb nazca en una cierta fecha no es un motivo para inducir el parto. CULES SON LOS MTODOS UTILIZADOS PARA INDUCIR EL TRABAJO DE PARTO? Algunos mtodos de induccin del trabajo de parto son:   Administracin del medicamentos prostaglandina. Este medicamento hace que el cuello uterino se dilate y madure. Este medicamento tambin iniciar las contracciones. Puede tomarse por  boca o insertarse en la vagina en forma de supositorio.  Insercin en la vagina de un tubo delgado (catter) con un baln en el extremo para dilatar el cuello del tero. Una vez insertado, el baln se infla con agua, lo que provoca la apertura del cuello del tero.  Ruptura de las membranas. El mdico separa el saco amnitico del cuello uterino, haciendo que el cuello uterino se distienda y cause la liberacin de la hormona llamada progesterona. Esto hace que el tero se contraiga. Este procedimiento se realiza durante una visita al consultorio mdico. Le indicarn que vuelva a su casa y espere que se inicien las contracciones. Luego tendr que volver para la induccin.  Ruptura de la bolsa de aguas. El mdico romper el saco amnitico con un pequeo instrumento. Una vez que el saco amnitico se rompe, las contracciones deben comenzar. Pueden pasar algunas horas hasta que haga efecto.  Medicamentos que desencadenen o intensifiquen las contracciones. Se lo administrarn a travs de un catter por va intravenosa (IV) que se inserta en una de las venas del brazo. Todos los mtodos de induccin, excepto la ruptura de membranas, se realizan en el hospital. La induccin se realizar en el hospital, de modo que usted y el beb puedan ser controlados cuidadosamente.  CUNTO TIEMPO LLEVA INDUCIR EL TRABAJO DE PARTO? Algunas inducciones pueden demorar entre 2 y 3 das. Generalmente lleva menos tiempo, dependiendo del estado del cuello del tero. Puede tomar ms tiempo si la induccin se realiza en etapas tempranas del embarazo o   es su primer embarazo. Si han pasado 2 o 3 das y no se inicia el trabajo de parto, podrn enviarla a su casa o realizar una cesrea. CULES SON LOS RIESGOS ASOCIADOS CON LA INDUCCiN DEL TRABAJO DE PARTO? Algunos de los riesgos de la induccin son:   Cambios en la frecuencia cardaca fetal, por ejemplo los latidos son demasiado rpidos, o lentos, o errticos.  Riesgo de distrs  fetal.  Posibilidad de infeccin en la madre o el beb.  Aumento de la posibilidad de que sea necesaria una cesrea.  Ruptura (abrupcin) de la placenta del tero (raro).  Ruptura uterina (muy raro). Cuando es necesario realizar la induccin por razones mdicas, los beneficios deben superar a los riesgos. CULES SON ALGUNAS RAZONES PARA NO INDUCIR EL TRABAJO DE PARTO? La induccin no debe realizarse si:   Se demuestra que el beb no tolera el trabajo de parto.  Fue sometida anteriormente a cirugas en el tero, como una miomectoma o le han extirpado fibromas.  La placenta est en una posicin muy baja en el tero y obstruye la abertura del cuello (placenta previa).  El beb no est ubicado con la cabeza hacia bajo.  El cordn umbilical cae hacia el canal de parto, adelante del beb. Esto puede cortar el suministro de sangre y oxgeno al beb.  Fue sometida a una cesrea anteriormente.  Hay circunstancias poco habituales, como que el beb es extremadamente prematuro.   Esta informacin no tiene como fin reemplazar el consejo del mdico. Asegrese de hacerle al mdico cualquier pregunta que tenga.   Document Released: 03/25/2008 Document Revised: 01/07/2015 Elsevier Interactive Patient Education 2016 Elsevier Inc.  

## 2016-08-15 ENCOUNTER — Encounter (HOSPITAL_COMMUNITY): Payer: Self-pay

## 2016-08-15 ENCOUNTER — Inpatient Hospital Stay (HOSPITAL_COMMUNITY)
Admission: RE | Admit: 2016-08-15 | Discharge: 2016-08-17 | DRG: 774 | Disposition: A | Discharge status: Home / Self Care | Payer: Medicaid Other | Source: Ambulatory Visit | Attending: Family Medicine | Admitting: Family Medicine

## 2016-08-15 DIAGNOSIS — O1002 Pre-existing essential hypertension complicating childbirth: Secondary | ICD-10-CM | POA: Diagnosis present

## 2016-08-15 DIAGNOSIS — O24414 Gestational diabetes mellitus in pregnancy, insulin controlled: Secondary | ICD-10-CM

## 2016-08-15 DIAGNOSIS — Z833 Family history of diabetes mellitus: Secondary | ICD-10-CM

## 2016-08-15 DIAGNOSIS — O24425 Gestational diabetes mellitus in childbirth, controlled by oral hypoglycemic drugs: Secondary | ICD-10-CM | POA: Diagnosis present

## 2016-08-15 DIAGNOSIS — O0993 Supervision of high risk pregnancy, unspecified, third trimester: Secondary | ICD-10-CM

## 2016-08-15 DIAGNOSIS — O09219 Supervision of pregnancy with history of pre-term labor, unspecified trimester: Secondary | ICD-10-CM

## 2016-08-15 DIAGNOSIS — Z3A39 39 weeks gestation of pregnancy: Secondary | ICD-10-CM

## 2016-08-15 DIAGNOSIS — O09899 Supervision of other high risk pregnancies, unspecified trimester: Secondary | ICD-10-CM

## 2016-08-15 DIAGNOSIS — Z3493 Encounter for supervision of normal pregnancy, unspecified, third trimester: Secondary | ICD-10-CM

## 2016-08-15 DIAGNOSIS — O24429 Gestational diabetes mellitus in childbirth, unspecified control: Secondary | ICD-10-CM | POA: Diagnosis not present

## 2016-08-15 LAB — CBC
HCT: 36.4 % (ref 36.0–46.0)
HEMOGLOBIN: 13.4 g/dL (ref 12.0–15.0)
MCH: 30.2 pg (ref 26.0–34.0)
MCHC: 36.8 g/dL — ABNORMAL HIGH (ref 30.0–36.0)
MCV: 82.2 fL (ref 78.0–100.0)
PLATELETS: 211 10*3/uL (ref 150–400)
RBC: 4.43 MIL/uL (ref 3.87–5.11)
RDW: 13.4 % (ref 11.5–15.5)
WBC: 11.5 10*3/uL — AB (ref 4.0–10.5)

## 2016-08-15 LAB — GLUCOSE, RANDOM: GLUCOSE: 89 mg/dL (ref 65–99)

## 2016-08-15 LAB — GLUCOSE, CAPILLARY
GLUCOSE-CAPILLARY: 124 mg/dL — AB (ref 65–99)
Glucose-Capillary: 96 mg/dL (ref 65–99)
Glucose-Capillary: 83 mg/dL (ref 65–99)

## 2016-08-15 LAB — OB RESULTS CONSOLE GBS: STREP GROUP B AG: NEGATIVE

## 2016-08-15 MED ORDER — LACTATED RINGERS IV SOLN: 500.0000 mL | Freq: Once | INTRAVENOUS | Status: DC

## 2016-08-15 MED ORDER — OXYTOCIN BOLUS FROM INFUSION: 500.0000 mL | Freq: Once | INTRAVENOUS | Status: DC

## 2016-08-15 MED ORDER — IBUPROFEN 600 MG PO TABS
600.0000 mg | ORAL_TABLET | Freq: Four times a day (QID) | ORAL | Status: DC
  Administered 2016-08-15 – 2016-08-17 (×7): 600 mg via ORAL
  Filled 2016-08-15 (×8): qty 1

## 2016-08-15 MED ORDER — LIDOCAINE HCL (PF) 1 % IJ SOLN
30.0000 mL | INTRAMUSCULAR | Status: DC | PRN
  Filled 2016-08-15: qty 30

## 2016-08-15 MED ORDER — PHENYLEPHRINE 40 MCG/ML (10ML) SYRINGE FOR IV PUSH (FOR BLOOD PRESSURE SUPPORT)
80.0000 ug | PREFILLED_SYRINGE | INTRAVENOUS | Status: DC | PRN
  Filled 2016-08-15: qty 5

## 2016-08-15 MED ORDER — MISOPROSTOL 25 MCG QUARTER TABLET
25.0000 ug | ORAL_TABLET | ORAL | Status: DC | PRN
  Administered 2016-08-15: 25 ug via VAGINAL
  Filled 2016-08-15: qty 0.25
  Filled 2016-08-15: qty 1

## 2016-08-15 MED ORDER — OXYTOCIN 40 UNITS IN LACTATED RINGERS INFUSION - SIMPLE MED
2.5000 [IU]/h | INTRAVENOUS | Status: DC
  Administered 2016-08-15: 2.5 [IU]/h via INTRAVENOUS
  Filled 2016-08-15: qty 1000

## 2016-08-15 MED ORDER — TERBUTALINE SULFATE 1 MG/ML IJ SOLN
0.2500 mg | Freq: Once | INTRAMUSCULAR | Status: DC | PRN
  Filled 2016-08-15: qty 1

## 2016-08-15 MED ORDER — EPHEDRINE 5 MG/ML INJ
10.0000 mg | INTRAVENOUS | Status: DC | PRN
  Filled 2016-08-15: qty 4

## 2016-08-15 MED ORDER — LACTATED RINGERS IV SOLN
INTRAVENOUS | Status: DC
  Administered 2016-08-15: 09:00:00 via INTRAVENOUS

## 2016-08-15 MED ORDER — OXYCODONE-ACETAMINOPHEN 5-325 MG PO TABS: 1.0000 | ORAL_TABLET | ORAL | Status: DC | PRN

## 2016-08-15 MED ORDER — FENTANYL CITRATE (PF) 100 MCG/2ML IJ SOLN
INTRAMUSCULAR | Status: AC
  Filled 2016-08-15: qty 2

## 2016-08-15 MED ORDER — LACTATED RINGERS IV SOLN: 500.0000 mL | INTRAVENOUS | Status: DC | PRN

## 2016-08-15 MED ORDER — FLEET ENEMA 7-19 GM/118ML RE ENEM: 1.0000 | ENEMA | RECTAL | Status: DC | PRN

## 2016-08-15 MED ORDER — FENTANYL CITRATE (PF) 100 MCG/2ML IJ SOLN
100.0000 ug | INTRAMUSCULAR | Status: DC | PRN
  Administered 2016-08-15: 100 ug via INTRAVENOUS

## 2016-08-15 MED ORDER — DIPHENHYDRAMINE HCL 50 MG/ML IJ SOLN: 12.5000 mg | INTRAMUSCULAR | Status: DC | PRN

## 2016-08-15 MED ORDER — FENTANYL 2.5 MCG/ML BUPIVACAINE 1/10 % EPIDURAL INFUSION (WH - ANES): 14.0000 mL/h | INTRAMUSCULAR | Status: DC | PRN

## 2016-08-15 MED ORDER — OXYCODONE-ACETAMINOPHEN 5-325 MG PO TABS: 2.0000 | ORAL_TABLET | ORAL | Status: DC | PRN

## 2016-08-15 MED ORDER — ACETAMINOPHEN 325 MG PO TABS: 650.0000 mg | ORAL_TABLET | ORAL | Status: DC | PRN

## 2016-08-15 MED ORDER — SOD CITRATE-CITRIC ACID 500-334 MG/5ML PO SOLN: 30.0000 mL | ORAL | Status: DC | PRN

## 2016-08-15 MED ORDER — ONDANSETRON HCL 4 MG/2ML IJ SOLN: 4.0000 mg | Freq: Four times a day (QID) | INTRAMUSCULAR | Status: DC | PRN

## 2016-08-15 NOTE — Progress Notes (Signed)
Pt doing well. Minimal Pain. Considering epidural. AROM at approximately 1950 for clear fluid. Category 1 fetal heart tracing contracting ever 3 minutes.

## 2016-08-15 NOTE — H&P (Signed)
LABOR AND DELIVERY ADMISSION HISTORY AND PHYSICAL NOTE  Doris Bishop is a 30 y.o. female [phone] with IUP at 5836w0d by LMP and 20week US presenting for IOL for GDMA2. Patient on glyburide at bedtime, reports blood sugar under good control.  She reports positive fetal movement. She denies leakage of fluid or vaginal bleeding.  Past Medical History: Past Medical History:  Diagnosis Date  . Diabetes mellitus without complication (HCC)   . Hypertension   . Preterm labor     Past Surgical History: Past Surgical History:  Procedure Laterality Date  . NO PAST SURGERIES      Obstetrical History: OB History    Gravida Para Term Preterm AB Living   4 3 2 1  0 3   SAB TAB Ectopic Multiple Live Births   0 0 0 0 3      Obstetric Comments   Spontaneous labor but diagnosed with preeclamspia      Social History: Social History   Social History  . Marital status: Significant Other    Spouse name: N/A  . Number of children: N/A  . Years of education: N/A   Social History Main Topics  . Smoking status: Never Smoker  . Smokeless tobacco: Never Used  . Alcohol use No  . Drug use: No  . Sexual activity: Yes    Birth control/ protection: None   Other Topics Concern  . None   Social History Narrative  . None    Family History: Family History  Problem Relation Age of Onset  . Diabetes Father     Allergies: No Known Allergies  Prescriptions Prior to Admission  Medication Sig Dispense Refill Last Dose  . glyBURIDE (DIABETA) 2.5 MG tablet Take 0.5 tablets (1.25 mg total) by mouth at bedtime. 30 tablet 2 08/14/2016 at Unknown time  . Prenatal Multivit-Min-Fe-FA (PRENATAL VITAMINS) 0.8 MG tablet Take 1 tablet by mouth daily. 30 tablet 12 08/14/2016 at Unknown time  . ACCU-CHEK FASTCLIX LANCETS MISC 1 each by Percutaneous route 4 (four) times daily. 100 each 12 Taking  . glucose blood test strip Use as instructed 100 each 12 Taking     Review of Systems   All  systems reviewed and negative except as stated in HPI  Blood pressure 120/61, pulse 86, temperature 98.5 F (36.9 C), temperature source Oral, resp. rate 16, height 5\' 2"  (1.575 m), weight 68.5 kg (151 lb), last menstrual period 10/05/2015, currently breastfeeding. General appearance: alert, cooperative and no distress Lungs: no respiratory distress Heart: regular rate Abdomen: soft, non-tender Extremities: No calf swelling or tenderness Presentation: cephalic by cervical check Fetal monitoring: Category I Uterine activity: q2-4 min Dilation: 2 Effacement (%): 50 Station: -2 Exam by:: Genevie AnnSchenk   Prenatal labs: ABO, Rh: --/--/O NEG (08/16 0900) Antibody: POS (08/16 0900) Rubella: Immune  RPR: Nonreactive (03/23 0000)  HBsAg: Negative (03/23 0000)  HIV: Non-reactive (03/23 0000)  GBS: Negative (08/16 0000)  Anatomy US: WNL  Prenatal Transfer Tool  Maternal Diabetes: Yes:  Diabetes Type:  Insulin/Medication controlled Genetic Screening: Declined Maternal Ultrasounds/Referrals: Normal Fetal Ultrasounds or other Referrals:  None Maternal Substance Abuse:  No Significant Maternal Medications:  Meds include: Other: glyburide Significant Maternal Lab Results: Lab values include: Group B Strep negative  Results for orders placed or performed during the hospital encounter of 08/15/16 (from the past 24 hour(s))  OB RESULT CONSOLE Group B Strep   Collection Time: 08/15/16 12:00 AM  Result Value Ref Range   GBS Negative   CBC  Collection Time: 08/15/16  9:00 AM  Result Value Ref Range   WBC 11.5 (H) 4.0 - 10.5 K/uL   RBC 4.43 3.87 - 5.11 MIL/uL   Hemoglobin 13.4 12.0 - 15.0 g/dL   HCT 16.136.4 09.636.0 - 04.546.0 %   MCV 82.2 78.0 - 100.0 fL   MCH 30.2 26.0 - 34.0 pg   MCHC 36.8 (H) 30.0 - 36.0 g/dL   RDW 40.913.4 81.111.5 - 91.415.5 %   Platelets 211 150 - 400 K/uL  Glucose, random   Collection Time: 08/15/16  9:00 AM  Result Value Ref Range   Glucose, Bld 89 65 - 99 mg/dL  Type and screen  [facility]   Collection Time: 08/15/16  9:00 AM  Result Value Ref Range   ABO/RH(D) O NEG    Antibody Screen POS    Sample Expiration 08/18/2016    Antibody Identification PASSIVELY ACQUIRED ANTI-D    DAT, IgG NEG    Unit Number [id number]    Blood Component Type RED CELLS,LR    Unit division 00    Status of Unit ALLOCATED    Transfusion Status OK TO TRANSFUSE    Crossmatch Result COMPATIBLE    Unit Number [id number]    Blood Component Type RED CELLS,LR    Unit division 00    Status of Unit ALLOCATED    Transfusion Status OK TO TRANSFUSE    Crossmatch Result COMPATIBLE   Glucose, capillary   Collection Time: 08/15/16  1:07 PM  Result Value Ref Range   Glucose-Capillary 124 (H) 65 - 99 mg/dL    Patient Active Problem List   Diagnosis Date Noted  . Normal pregnancy in third trimester 08/15/2016  . Unstable lie of fetus 07/19/2016  . History of gestational hypertension 06/25/2016  . RhD negative 06/25/2016  . Supervision of high risk pregnancy, antepartum 06/12/2016  . Gestational diabetes mellitus (GDM), antepartum 06/12/2016  . H/O preterm delivery, currently pregnant 06/12/2016  . Language barrier 10/19/2014    Assessment: Doris Bishop is a 30 y.o. [id number] with IUP at 10582w0d by LMP and 20week US presenting for IOL for GDMA2.  #Labor:IOL, cytotec and foley bulb, plan to transition pitocin s/p foley bulb #Pain: Undecided on epidural #FWB: Category I, cont monitoring #ID:  GBS neg #MOF: breast #MOC:paraguard #Circ:  no  Leland HerElsia J Yoo, DO PGY-1, [facility] 8/16/20173:37 PM   OB FELLOW HISTORY AND PHYSICAL ATTESTATION  I have seen and examined this patient; I agree with above documentation in the resident's note.    Ernestina Pennaicholas Schenk 08/15/2016, 4:02 PM

## 2016-08-15 NOTE — Anesthesia Pain Management Evaluation Note (Signed)
  CRNA Pain Management Visit Note  Patient: Doris Bishop, 30 y.o., female  "Hello [facility] am a [facility]. We have an anesthesia team available at all times to provide care throughout the hospital, including epidural management and anesthesia for C-section. I don't know your plan for the delivery whether it a natural birth, water birth, IV sedation, nitrous supplementation, doula or epidural, but we want to meet your pain goals."   1.Was your pain managed to your expectations on prior hospitalizations?   Yes   2.What is your expectation for pain management during this hospitalization?     Labor support without medications  3.How can we help you reach that goal? This is fourth pregnancy and pt would like to have natural delivery because she has for last 2 children. Was open to discuss pain control options.  Record the patient's initial score and the patient's pain goal.   Pain: 0  Pain Goal: 8 [facility] wants you to be able to say your pain was always managed very well.  Doris Bishop 08/15/2016

## 2016-08-16 LAB — RPR: RPR Ser Ql: NONREACTIVE

## 2016-08-16 MED ORDER — ONDANSETRON HCL 4 MG PO TABS: 4.0000 mg | ORAL_TABLET | ORAL | Status: DC | PRN

## 2016-08-16 MED ORDER — TETANUS-DIPHTH-ACELL PERTUSSIS 5-2.5-18.5 LF-MCG/0.5 IM SUSP: 0.5000 mL | Freq: Once | INTRAMUSCULAR | Status: DC

## 2016-08-16 MED ORDER — RHO D IMMUNE GLOBULIN 1500 UNIT/2ML IJ SOSY
300.0000 ug | PREFILLED_SYRINGE | Freq: Once | INTRAMUSCULAR | Status: AC
  Administered 2016-08-16: 300 ug via INTRAVENOUS
  Filled 2016-08-16: qty 2

## 2016-08-16 MED ORDER — DIPHENHYDRAMINE HCL 25 MG PO CAPS: 25.0000 mg | ORAL_CAPSULE | Freq: Four times a day (QID) | ORAL | Status: DC | PRN

## 2016-08-16 MED ORDER — SENNOSIDES-DOCUSATE SODIUM 8.6-50 MG PO TABS
2.0000 | ORAL_TABLET | ORAL | Status: DC
  Filled 2016-08-16: qty 2

## 2016-08-16 MED ORDER — SIMETHICONE 80 MG PO CHEW: 80.0000 mg | CHEWABLE_TABLET | ORAL | Status: DC | PRN

## 2016-08-16 MED ORDER — BENZOCAINE-MENTHOL 20-0.5 % EX AERO: 1.0000 "application " | INHALATION_SPRAY | CUTANEOUS | Status: DC | PRN

## 2016-08-16 MED ORDER — DIBUCAINE 1 % RE OINT: 1.0000 "application " | TOPICAL_OINTMENT | RECTAL | Status: DC | PRN

## 2016-08-16 MED ORDER — COCONUT OIL OIL: 1.0000 "application " | TOPICAL_OIL | Status: DC | PRN

## 2016-08-16 MED ORDER — PRENATAL MULTIVITAMIN CH
1.0000 | ORAL_TABLET | Freq: Every day | ORAL | Status: DC
  Administered 2016-08-16 – 2016-08-17 (×2): 1 via ORAL
  Filled 2016-08-16 (×2): qty 1

## 2016-08-16 MED ORDER — ACETAMINOPHEN 325 MG PO TABS: 650.0000 mg | ORAL_TABLET | ORAL | Status: DC | PRN

## 2016-08-16 MED ORDER — ONDANSETRON HCL 4 MG/2ML IJ SOLN: 4.0000 mg | INTRAMUSCULAR | Status: DC | PRN

## 2016-08-16 MED ORDER — WITCH HAZEL-GLYCERIN EX PADS: 1.0000 "application " | MEDICATED_PAD | CUTANEOUS | Status: DC | PRN

## 2016-08-16 MED ORDER — ZOLPIDEM TARTRATE 5 MG PO TABS: 5.0000 mg | ORAL_TABLET | Freq: Every evening | ORAL | Status: DC | PRN

## 2016-08-16 NOTE — Progress Notes (Signed)
Patient ID: Doris Bishop, female   DOB: 03-Sep-1986, 30 y.o.   MRN: [id number]  POSTPARTUM PROGRESS NOTE  Post Partum Day #1 Subjective:  Doris Bishop is a 30 y.o. [id number] 822w0d s/p NSVD.  No acute events overnight.  Pt denies problems with ambulating, voiding or po intake.  She denies nausea or vomiting.  Pain is well controlled.  She has had flatus. She has not had bowel movement.  Lochia Minimal.   Objective: Blood pressure (!) 107/55, pulse 84, temperature 97.7 F (36.5 C), temperature source Oral, resp. rate 18, height 5\' 2"  (1.575 m), weight 151 lb (68.5 kg), last menstrual period 10/05/2015, unknown if currently breastfeeding.  Physical Exam:  General: alert, cooperative and no distress Lochia:normal flow Chest: CTAB Heart: RRR no m/r/g Abdomen: +BS, soft, nontender,  Uterine Fundus: firm, below umbilicus DVT Evaluation: No calf swelling or tenderness Extremities: Minimal edema   Recent Labs  08/15/16 0900  HGB 13.4  HCT 36.4    Assessment/Plan:  ASSESSMENT: Doris Bishop is a 30 y.o. [id number] 7522w0d s/p NSVD  Plan for discharge tomorrow, Breastfeeding and Contraception IUD/Paraguard   LOS: 1 day   Jen MowElizabeth Mumaw, DO 08/16/2016, 11:03 PM

## 2016-08-16 NOTE — Progress Notes (Signed)
Post Partum Day 1 Subjective: no complaints, up ad lib, voiding and tolerating PO. Patient is planning on breast feeding and supplementing with bottles. She is getting an IUD for contraception.  Objective: Blood pressure 113/64, pulse 84, temperature 98 F (36.7 C), resp. rate 18, height 5\' 2"  (1.575 m), weight 68.5 kg (151 lb), last menstrual period 10/05/2015, unknown if currently breastfeeding.  Physical Exam:  General: alert, cooperative and no distress Lochia: appropriate Uterine Fundus: firm   Recent Labs  08/15/16 0900  HGB 13.4  HCT 36.4    Assessment/Plan: Plan for discharge tomorrow   LOS: 1 day   Doris MoralesSharon Bishop 08/16/2016, 7:30 AM    OB FELLOW MEDICAL STUDENT NOTE ATTESTATION  I have seen and examined this patient. Note this is a Psychologist, occupationalmedical student note and as such does not necessarily reflect the patient's plan of care. Please see progress note for this date of service.    Jen MowElizabeth Mumaw, DO OB Fellow

## 2016-08-16 NOTE — Lactation Note (Signed)
This note was copied from a baby's chart. Lactation Consultation Note  Patient Name: Doris Bishop UJWJX'BToday's Date: 08/16/2016 Reason for consult: Initial assessment   Initial visit with Exp BF mom of 12 hour old infant. Eda, Hospital Interpreter present for interpreting. Infant with 7 BF for 15-30 minutes, 2 voids and 2 stools since birth. Infant weight 6 lb 5.9 oz. Mom is GDM on Glyburide. Latch Scores 9 by bedside RN.  Mom reports that BF is going well. She denies nipple pain with feeding. She requested to have a DEBP set up to begin pumping to supplement infant with EBM. She reports she only wants to use her milk. Mom and dad agreed for him to be interpreter for pump set up.   BF Resources Handout and LC Brochure given, Mom was informed of IP/OP Services, LC phone # and BF Support Groups. Mom reports she would like to have a manual pump for home use. [facility] and is aware to call and make an appointment after d/c.   Returned later to set up DEBP with dad interpreting. Parents were shown how to set up, assemble, disassemble and clean pump parts. Mom with large breasts and everted nipples. Mom reported she would like to supplement EBM via bottle. Mom began pumping and was getting colostrum from both breasts. She was given nipples to use to supplement infant. Parents without further questions, will follow up tomorrow and prn.    Maternal Data Formula Feeding for Exclusion: No Does the patient have breastfeeding experience prior to this delivery?: Yes  Feeding Feeding Type: Breast Fed Length of feed: 20 min  LATCH Score/Interventions                      Lactation Tools Discussed/Used WIC Program: Yes Pump Review: Setup, frequency, and cleaning;Milk Storage Initiated by:: Noralee StainSharon Salimata Christenson, RN, IBCLC Date initiated:: 08/17/16   Consult Status Consult Status: Follow-up Date: 08/17/16 Follow-up type: In-patient    Silas FloodSharon S Rumaysa Sabatino 08/16/2016, 11:40  AM

## 2016-08-17 LAB — RH IG WORKUP (INCLUDES ABO/RH)
ABO/RH(D): O NEG
FETAL SCREEN: NEGATIVE
GESTATIONAL AGE(WKS): 39
Unit division: 0

## 2016-08-17 LAB — GLUCOSE, CAPILLARY: GLUCOSE-CAPILLARY: 91 mg/dL (ref 65–99)

## 2016-08-17 MED ORDER — IBUPROFEN 600 MG PO TABS: 600.0000 mg | ORAL_TABLET | Freq: Four times a day (QID) | ORAL | 0 refills | Status: AC

## 2016-08-17 NOTE — Lactation Note (Signed)
This note was copied from a baby's chart. Lactation Consultation Note  Patient Name: Doris Bishop WUJWJ'XToday's Date: 08/17/2016 Reason for consult: Follow-up assessment FOB present to interpret. Mom denies and questions/concerns. Reports baby BF well. Advised to call if needs lactation assist or has questions/concerns.   Maternal Data    Feeding Feeding Type: Breast Fed  LATCH Score/Interventions Latch: Grasps breast easily, tongue down, lips flanged, rhythmical sucking.  Audible Swallowing: Spontaneous and intermittent  Type of Nipple: Everted at rest and after stimulation  Comfort (Breast/Nipple): Soft / non-tender     Hold (Positioning): Assistance needed to correctly position infant at breast and maintain latch.  LATCH Score: 9  Lactation Tools Discussed/Used     Consult Status Consult Status: Complete Date: 08/17/16 Follow-up type: In-patient    Doris Bishop, Doris Bishop 08/17/2016, 12:22 PM

## 2016-08-17 NOTE — Discharge Summary (Signed)
OB Discharge Summary     Patient Name: Doris Bishop DOB: 01/09/1986 MRN: [id number]  Date of admission: 08/15/2016 Delivering MD: Lynda RainwaterIETTE, STACIE ANN   Date of discharge: 08/17/2016  Admitting diagnosis: INDUCTION Intrauterine pregnancy: 7665w0d     Secondary diagnosis:  Active Problems:   Normal pregnancy in third trimester  Additional problems: None     Discharge diagnosis: Term Pregnancy Delivered and GDM A2                                                                                                Post partum procedures:rhogam  Augmentation: AROM, Cytotec and Foley Balloon  Complications: None  Hospital course:  Induction of Labor With Vaginal Delivery   30 y.o. yo [id number] at 765w0d was admitted to the hospital 08/15/2016 for induction of labor.  Indication for induction: A2 DM.  Patient had an uncomplicated labor course as follows: Membrane Rupture Time/Date: 7:45 PM ,08/15/2016   Intrapartum Procedures: Episiotomy: None [1]                                         Lacerations:  None [1]  Patient had delivery of a Viable infant.  Information for the patient's newborn:  Doris Bishop [[id number]][[id number]]  Delivery Method: Vaginal, Spontaneous Delivery (Filed from Delivery Summary)   08/15/2016  Details of delivery can be found in separate delivery note.  Patient had a routine postpartum course. Patient is discharged home 08/17/16.   Physical exam Vitals:   08/16/16 0359 08/16/16 1246 08/16/16 1715 08/17/16 0623  BP: 113/64 (!) 113/54 (!) 107/55 119/74  Pulse: 84 80 84 70  Resp: 18 18 18 18   Temp: 98 F (36.7 C) 98.3 F (36.8 C) 97.7 F (36.5 C) 98 F (36.7 C)  TempSrc:  Axillary Oral Oral  Weight:      Height:       General: alert, cooperative and no distress Lochia: appropriate Uterine Fundus: firm Incision: N/A DVT Evaluation: Negative Homan's sign. Labs: Lab Results  Component Value Date   WBC 11.5 (H) 08/15/2016   HGB 13.4  08/15/2016   HCT 36.4 08/15/2016   MCV 82.2 08/15/2016   PLT 211 08/15/2016   CMP Latest Ref Rng & Units 08/15/2016  Glucose 65 - 99 mg/dL 89  BUN 7 - 25 mg/dL -  Creatinine 1.300.50 - 8.651.10 mg/dL -  Sodium 784135 - 696146 mmol/L -  Potassium 3.5 - 5.3 mmol/L -  Chloride 98 - 110 mmol/L -  CO2 20 - 31 mmol/L -  Calcium 8.6 - 10.2 mg/dL -  Total Protein 6.1 - 8.1 g/dL -  Total Bilirubin 0.2 - 1.2 mg/dL -  Alkaline Phos 33 - 295115 U/L -  AST 10 - 30 U/L -  ALT 6 - 29 U/L -    Discharge instruction: per After Visit Summary and "Baby and Me Booklet".  After visit meds:    Medication List    STOP taking these medications   ACCU-CHEK FASTCLIX LANCETS Misc  glucose blood test strip   glyBURIDE 2.5 MG tablet Commonly known as:  DIABETA     TAKE these medications   ibuprofen 600 MG tablet Commonly known as:  ADVIL,MOTRIN Take 1 tablet (600 mg total) by mouth every 6 (six) hours.   Prenatal Vitamins 0.8 MG tablet Take 1 tablet by mouth daily.       Diet: routine diet  Activity: Advance as tolerated. Pelvic rest for 6 weeks.   Outpatient follow up:6 weeks Follow-up Information    [facility]   Specialty:  Obstetrics and Gynecology Why:  Postpartum visit and 2 hour Glucose tolerance test Contact information: [address] [place] [id number] [phone]       [facility] .   Why:  as needed in emergencies Contact information: [address] [id number] [place] [id number] [phone]          Postpartum contraception: IUD Mirena  Newborn Data: Live born female  Birth Weight: 6 lb 5.9 oz (2889 g) APGAR: 8, 9  Baby Feeding: Breast Disposition:home with mother   08/17/2016 Dorathy KinsmanVirginia Jhoselin Bishop, CNM

## 2016-08-17 NOTE — Discharge Summary (Signed)
Physician Discharge Summary  Patient ID: Doris Bishop MRN: [id number] DOB/AGE: Aug 02, 1986 30 y.o.  Admit date: 08/15/2016 Discharge date: 08/17/2016  Admission Diagnoses:  1. Supervision of high risk pregnancy, antepartum, third trimester   2. Medication controlled gestational diabetes mellitus (GDM) during pregnancy, antepartum   3. H/O preterm delivery, currently pregnant, unspecified trimester      Discharge Diagnoses: SVD of term pregnancy  Hospital Course: Patient is a 30 year old G4P4 who presented on 8/16 for induction of labor secondary to gestational diabetes. She was 39 weeks at the time of delivery. Labor was assisted by a foley, cytotec, and AROM. She needed no sutures for repair. Baby's APGARs were 8,9. Patient has progressed without complaints or complications. On day of discharge she is well appearing, her pain is controlled, and her lochia is appropriate.   Discharge Exam: Blood pressure 119/74, pulse 70, temperature 98 F (36.7 C), temperature source Oral, resp. rate 18, height 5\' 2"  (1.575 m), weight 68.5 kg (151 lb), last menstrual period 10/05/2015, unknown if currently breastfeeding. General: alert, cooperative and no distress Lochia: appropriate Uterine Fundus: firm Incision: N/A, patient was SVD  Disposition: 01-Home or Self Care - discussed with patient the importance of visiting her OB or primary care doctor in 6 weeks for a repeat GTT.   SignedHal Morales: Sharon Eshet 08/17/2016, 8:08 AM   I repeated the exam and agree with above.  SterlingVirginia Odile Veloso, [place] Island[place]M 08/22/2016 12:03 PM

## 2016-08-17 NOTE — Discharge Instructions (Signed)
Parto vaginal, Cuidados posteriores  °(Vaginal Delivery, Care After) °Siga estas instrucciones durante las próximas semanas. Estas indicaciones para el alta le proporcionan información general acerca de cómo deberá cuidarse después del parto. El médico también podrá darle instrucciones específicas. El tratamiento ha sido planificado según las prácticas médicas actuales, pero en algunos casos pueden ocurrir problemas. Comuníquese con el médico si tiene algún problema o tiene preguntas al volver a su casa.  °INSTRUCCIONES PARA EL CUIDADO EN EL HOGAR  °· Tome sólo medicamentos de venta libre o recetados, según las indicaciones del médico o del farmacéutico. °· No beba alcohol, especialmente si está amamantando o toma analgésicos. °· No mastique tabaco ni fume. °· No consuma drogas. °· Continúe con un adecuado cuidado perineal. El buen cuidado perineal incluye: °¨ Higienizarse de adelante hacia atrás. °¨ Mantener la zona perineal limpia. °· No use tampones ni duchas vaginales hasta que su médico la autorice. °· Dúchese, lávese el cabello y tome baños de inmersión según las indicaciones de su médico. °· Utilice un sostén que le ajuste bien y que brinde buen soporte a sus mamas. °· Consuma alimentos saludables. °· Beba suficiente líquido para mantener la orina clara o de color amarillo pálido. °· Consuma alimentos ricos en fibra como cereales y panes integrales, arroz, frijoles y frutas y verduras frescas todos los días. Estos alimentos pueden ayudarla a prevenir o aliviar el estreñimiento. °· Siga las recomendaciones de su médico relacionadas con la reanudación de actividades como subir escaleras, conducir automóviles, levantar objetos, hacer ejercicios o viajar. °· Hable con su médico acerca de reanudar la actividad sexual. Volver a la actividad sexual depende del riesgo de infección, la velocidad de la curación y la comodidad y su deseo de reanudarla. °· Trate de que alguien la ayude con las actividades del hogar y con  el recién nacido al menos durante un par de días después de salir del hospital. °· Descanse todo lo que pueda. Trate de descansar o tomar una siesta mientras el bebé está durmiendo. °· Aumente sus actividades gradualmente. °· Cumpla con todas las visitas de control programadas para después del parto. Es muy importante asistir a todas las citas programadas de seguimiento. En estas citas, su médico va a controlarla para asegurarse de que esté sanando física y emocionalmente. °SOLICITE ATENCIÓN MÉDICA SI:  °· Elimina coágulos grandes por la vagina. Guarde algunos coágulos para mostrarle al médico. °· Tiene una secreción con feo olor que proviene de la vagina. °· Tiene dificultad para orinar. °· Orina con frecuencia. °· Siente dolor al orinar. °· Nota un cambio en sus movimientos intestinales. °· Aumenta el enrojecimiento, el dolor o la hinchazón en la zona de la incisión vaginal (episiotomía) o el desgarro vaginal. °· Tiene pus que drena por la episiotomía o el desgarro vaginal. °· La episiotomía o el desgarro vaginal se abren. °· Sus mamas le duelen, están duras o enrojecidas. °· Sufre un dolor intenso de cabeza. °· Tiene visión borrosa o ve manchas. °· Se siente triste o deprimida. °· Tiene pensamientos acerca de lastimarse o dañar al recién nacido. °· Tiene preguntas acerca de su cuidado personal, el cuidado del recién nacido o acerca de los medicamentos. °· Se siente mareada o sufre un desmayo. °· Tiene una erupción. °· Tiene náuseas o vómitos. °· Usted amamantó al bebé y no ha tenido su período menstrual dentro de las 12 semanas después de dejar de amamantar. °· No amamanta al bebé y no tuvo su período menstrual en las últimas 12° semanas después del   partoLance Muss.  Tiene fiebre. SOLICITE ATENCIN MDICA DE INMEDIATO SI:   Siente dolor persistente.  Siente dolor en el pecho.  Le falta el aire.  Se desmaya.  Siente dolor en la pierna.  Siente Physiological scientistdolor en el estmago.  El sangrado vaginal satura dos o ms  apsitos en 1 hora.   Esta informacin no tiene Theme park managercomo fin reemplazar el consejo del mdico. Asegrese de hacerle al mdico cualquier pregunta que tenga.   Document Released: 12/17/2005 Document Revised: 09/07/2015 Elsevier Interactive Patient Education 2016 ArvinMeritorElsevier Inc.   Desafos y soluciones para Mining engineerel amamantamiento (Breastfeeding Challenges and Solutions) Aunque el amamantamiento es un proceso natural, puede presentar desafos, en especial durante las primeras semanas despus del nacimiento del beb. Al comenzar a amamantar al nuevo beb, es normal que surjan problemas, incluso si ha Hewlett-Packardamamantado antes. En este documento, se indican algunas soluciones para los desafos ms frecuentes del amamantamiento.  DESAFOS Y SOLUCIONES Desafo: pezones irritados o agrietados Es frecuente que las madres que [address] Roadamamantan tengan los pezones irritados o Chief Strategy Officeragrietados. Los pezones agrietados o irritados a menudo se deben a que la boca del beb se prende de forma inadecuada para Museum/gallery exhibitions officeramamantar. Tambin puede haber irritacin si el beb no est ubicado como corresponde Rite Aiden la mama. Si bien es frecuente DIRECTVtener los pezones irritados y agrietados durante la primera semana posterior al nacimiento, el dolor en los pezones no es normal nunca. Si tiene los pezones irritados o agrietados durante ms de Kayak Point1semana, o si tiene Federated Department Storesdolor en los pezones, llame a su mdico o al Holiday representativeasesor en lactancia.  Solucin Siga los siguientes pasos para asegurarse de que el beb se prenda y est ubicado como corresponde:  Encuentre un lugar cmodo para sentarse o Teacher, musicacostarse, con un buen respaldo para el cuello y la espalda.  Coloque una almohada o una manta enrollada debajo del beb para acomodarlo a la altura de la mama (si est sentada).  Asegrese de que el abdomen del beb est frente al suyo.  Masajee suavemente la mama. Con las yemas de los dedos, masajee la pared del pecho hacia el pezn en un movimiento circular. Esto estimula el flujo de Pais[place] Parkleche. Es  posible que Engineer, manufacturing systemsdeba continuar este movimiento mientras amamanta si la leche fluye lentamente.  Sostenga la mama con el pulgar por arriba del pezn y los otros 4 dedos por debajo de la mama. Asegrese de que los dedos se encuentren lejos del pezn y de la boca del beb.  Empuje suavemente los labios del beb con el pezn o con el dedo.  Cuando la boca del beb se abra lo suficiente, acrquelo rpidamente a la mama e introduzca todo el pezn y la zona oscura que lo rodea (areola), tanto como sea posible, dentro de la boca del beb.  Debe haber ms areola visible por arriba del labio superior del beb que por debajo del labio inferior.  La lengua del beb debe estar entre la enca inferior y la Mustang Ridgemama.  Asegrese de que la boca del beb est en la posicin correcta alrededor del pezn (prendida). Los labios del beb deben crear un sello sobre la mama, doblndose hacia afuera (invertidos).  Es comn que el beb succione durante 2 a 3 minutos para que comience el flujo de Roxanaleche materna. Entre los signos de que el beb se ha prendido Audiological scientistadecuadamente al pezn, se incluyen:   El beb tironea o succiona con tranquilidad, sin causarle dolor.  Se escucha que traga cada 3 o 4 succiones.  Hace movimientos musculares por Tomasita Crumblearriba  y por delante de sus odos al Printmaker. Entre los signos de que el beb no se ha prendido Audiological scientist al pezn, se incluyen:   Ruidos de succin o de chasquido Patent attorney.  Dolor en el pezn. Para asegurarse de Tesoro Corporation y sanos, haga lo siguiente:  No se lave los pezones con [place].  Use un sostn de soporte. Evite usar sostenes con aro o sostenes ajustados.  Deje secar al aire los pezones durante 3 a despus de amamantar al beb.  Utilice solamente almohadillas de algodn en el sostn para absorber las prdidas de [place]. La prdida de un poco de Deere & Company las tomas es normal. Asegrese de Multimedia programmer las almohadillas si se empapan  con [place].  Colquese lanolina United Stationers pezones despus de Museum/gallery exhibitions officer. La lanolina ayuda a mantener la humedad normal de la piel. Si [place] lanolina pura, no tiene que lavarse los pezones antes de Corporate treasurer al beb. La lanolina pura no es txica para el beb. Tambin puede extraer Teachers Insurance and Annuity Association gotas de [place], [place] con suavidad sobre los pezones y dejar que se seque al aire. Desafo: congestin mamaria La Gap Inc se produce cuando las [address] se llenan excesivamente de [place]. Las primeras semanas despus del nacimiento, usted puede tener congestin [place]. La congestin International Business Machines mamas latan y estn duras, muy tirantes, calientes y sensibles. La congestin llega a un pico mximo alrededor del quinto da despus del nacimiento. El hecho de tener congestin mamaria no significa que deba dejar de Museum/gallery exhibitions officer al beb. Solucin  Alimente al beb cuando muestre signos de hambre o si siente la necesidad de reducir la congestin de las [place]. Esto se denomina "lactancia a demanda".  Los recin nacidos (bebs de menos de 4semanas de vida) a menudo se alimentan cada 1 a 3 horas Administrator. Es posible que deba despertar al beb si est durmiendo cuando es hora de [place].  No deje que el beb duerma ms de 5horas durante la noche sin amamantarlo.  Extraiga leche de forma manual o con un sacaleche antes de amamantar para ablandar la mama, la areola y el pezn.  Aplique calor hmedo (en la ducha o con toallas de mano humedecidas con agua tibia) justo antes de amamantar o de extraer [place], o masajee la mama antes de amamantar o mientras lo hace. Esto aumenta la circulacin y [place] a que la [place].  Vace por completo las mamas al QUALCOMM o extraer [place]. Despus, use un sostn tipo deportivo (para amamantar o comn) o una camiseta sin mangas durante 1 o 2 das para indicarle al cuerpo que disminuya ligeramente la produccin de [place]. Use solamente un  sostn tipo deportivo o una camiseta sin mangas para tratar la congestin. Las M.D.C. Holdings que amamantan habitualmente deben evitar los sostenes ajustados. Una vez que la congestin se [place], vuelva a usar ropa [place], [place].  Aplquese compresas de hielo en las mamas para reducir Chief Technology Officer de la congestin y Technical sales engineer hinchazn, excepto si el hielo le resulta incmodo.  No retrase los horarios de amamantamiento. Intente relajarse cuando sea la hora de alimentar al beb. Esto ayuda a Licensed conveyancer "reflejo de bajada", que hace que la [place] comience a salir de Educational psychologist.  Asegrese de que el beb est bien prendido a la mama y que se encuentre en la posicin correcta mientras lo [place].  Deje que el beb permanezca en la mama todo el tiempo que est prendido bien y que  succione activamente. El beb le har saber que ha terminado de alimentarse cuando se desprenda de la mama o se quede dormido.  Evite darle biberones o chupetes al beb en las primeras semanas de amamantamiento. Para introducir estos elementos, espere hasta despus de resolver cualquier desafo con el amamantamiento.  Si regresa al Aleen Campitrabajo o est fuera de su casa durante un perodo prolongado, trate de Marine scientistextraerse leche en el mismo horario que debera amamantar al beb.  Tome mucho lquido para Statisticianevitar la deshidratacin, que con el tiempo puede aumentar el riesgo de Webstercongestin mamaria. Si sigue estas indicaciones, la congestin debe mejorar en 24 a 48 horas. Si an tiene dificultades, llame a su mdico o al Holiday representativeasesor en lactancia.  Desafo: conductos galactforos obstruidos Los conductos galactforos se obstruyen cuando no drenan la leche como corresponde y, en consecuencia, se hinchan. El uso de un sostn para Museum/gallery exhibitions officeramamantar ajustado o las dificultades para que el beb se prenda pueden causar la obstruccin de estos conductos. El hecho de no tomar la cantidad suficiente de agua (de 8 a 10vasos [1,9 a 2,4l] por Futures traderda) puede contribuir a que los conductos  galactforos se obstruyan. Una vez que un conducto se obstruye, puede tener bultos duros, irritacin y Copyenrojecimiento en la mama.  Solucin No retrase los horarios de Librarian, academicamamantamiento. Alimente a su beb con frecuencia y trate de vaciar sus pechos cada vez que lo amamanta. Trate de alimentarlo primero del lado afectado ya que habr ms probabilidad de que se vace por completo ese pecho. Aplique toallas hmedas calientes durante 5 a 10minutos antes de amamantar. De forma alternativa, una ducha caliente justo antes de Museum/gallery exhibitions officeramamantar puede ofrecer el calor hmedo que estimula el flujo de San Carlos IIleche. Tambin puede ser de Mohawk Industriesayuda masajear suavemente la zona irritada antes de Museum/gallery exhibitions officeramamantar y Candlewood Shoresmientras est Fullertonamamantando. Evite usar ropa Indonesiaajustada o sostenes que presionen las Midtownmamas. Use sostenes que sostengan bien las Upper W[place] Gulchmamas, pero evite usar sostenes con arco. Si se le obstruye un conducto galactfero y tiene Poso Parkfiebre, debe ver a su mdico.  Desafo: mastitis La mastitis es la inflamacin de la mama. Por lo general, est causada por una infeccin bacteriana y puede provocar sntomas similares a los de Emergency planning/management officerla gripe. Puede tener enrojecimiento de la mama y Pickrellfiebre. A menudo, cuando hay una mastitis, la mama est dura y caliente, y duele mucho. Las causas ms frecuentes de la mastitis son la mala prendida o la succin inadecuada del beb, presin constante en la mama (posiblemente por usar un sostn ajustado o una camisa que limite el flujo de Herndonleche), estrs o fatiga anormales, o no Museum/gallery exhibitions officeramamantar con regularidad.  Solucin Le recetarn un antibitico para tratar la infeccin. Sigue siendo importante que amamante con frecuencia para vaciar las McFarlandmamas. Seguir Qwest Communicationsamamantando mientras se recupera de la mastitis no le har dao al beb. Asegrese de que el beb est bien ubicado cada vez que lo Upper Exeteramamanta. Aplique calor hmedo en las mamas durante unos minutos antes de amamantar para ayudar al flujo de la East Columbialeche y a que las mamas se vacen ms  fcilmente. Desafo: candidiasis La candidiasis es una infeccin por hongos que se forma en los pezones, en la mama o en la boca del beb. Causa picazn, dolor, ardor o dolor punzante, y a veces una erupcin.  Solucin Le recetarn un ungento para los Circle D-KC Estatespezones, y su beb recibir un medicamento lquido para Government social research officerla boca. Es importante que usted y el beb reciban tratamiento de forma simultnea, porque pueden transmitirse la candidiasis entre ustedes. Cambie los apsitos  mamarios desechables con frecuencia. Cada da, debe lavar con agua muy caliente cualquier sostn, toalla o ropa que entre en contacto con las reas infectadas de su cuerpo o del cuerpo del beb. Lvese las manos y lave las manos del beb con frecuencia. Hierva durante 20 minutos, una vez al da, todos los chupetes, las tetinas de los biberones y los juguetes que el beb se lleve a la boca. Despus de 1 semana de tratamiento, deseche los chupetes y las tetinas de los biberones y compre unos nuevos. Debe hervir durante 20 minutos, todos los McVeytowndas, todos los componentes del sacaleche que estn en contacto con la Inmanleche. Desafo: poca produccin de Clorox Companyleche Si el beb no aumenta de peso como corresponde, es posible que usted no est produciendo US Airwayssuficiente leche. La produccin de Colgate Palmoliveleche materna est basada en un sistema de oferta y Amazoniademanda. La produccin de WPS Resourcesleche depende de la frecuencia y la eficacia con la que el beb vaca la mama.  Solucin Cuanto ms amamante y se extraiga Bunker Hillleche, ms Comcastleche producir. Es importante que el beb vace al menos una de las mamas en cada alimentacin. Si no es as, use un sacaleche o extraiga la leche restante de forma manual. Esto ayudar a drenar la mayor cantidad de Raymondvilleleche posible cada vez que amamanta. Tambin ser Merrill Lynchuna seal para que el cuerpo produzca ms Wainwrightleche. Si el beb no le est vaciando las [address]amas, esto puede deberse a problemas de prendida, succin o ubicacin. Si sigue teniendo poca leche despus de Health Netresolver estos  problemas, comunquese con su mdico o con un especialista en lactancia lo antes posible. Desafo: pezones invertidos o planos En algunas mujeres, los pezones estn metidos hacia adentro en vez de sobresalir. Otras mujeres tiene pezones planos. Los pezones invertidos o planos a veces pueden hacer que el beb tenga ms dificultades para prenderse a la mama. Solucin Le darn un pequeo dispositivo que saca hacia afuera los pezones invertidos. Este dispositivo debe colocarse inmediatamente antes de poner al beb en la mama. Tambin puede tratar de usar Oceanographerel sacaleche durante un corto tiempo antes de colocar al beb en la mama. El sacaleche puede sacar el pezn hacia afuera para ayudar a su beb a que se agarre con mayor facilidad. Tambin la succin del beb ayudar a que el pezn invertido sobresalga.  Si tiene los pezones planos, estimule al beb para que se prenda a la mama y Technical sales engineeramamante con frecuencia los primeros das despus del nacimiento. Esto le dar la prctica para que se prenda correctamente mientras la mama est blanda. Cuando la produccin de leche aumente entre el segundo y el quinto da despus del parto y las mamas se llenen, el beb se prender ms fcilmente.  Si sigue teniendo inquietudes, comunquese con Press photographerun asesor en lactancia, quien podr ensearle tcnicas adicionales para resolver los problemas de amamantamiento relacionados con la posicin y la forma del pezn.  PARA OBTENER MS INFORMACIN Liga internacional La Leche: [email or web address].   Esta informacin no tiene como fin reemplazar el consejo del mdico. Asegrese de hacerle al mdico cualquier pregunta que tenga.   Document Released: 06/04/2008 Document Revised: 01/07/2015 Elsevier Interactive Patient Education Yahoo[employer] Inc2016 Elsevier Inc.

## 2016-08-19 LAB — TYPE AND SCREEN
ABO/RH(D): O NEG
Antibody Screen: POSITIVE
DAT, IGG: NEGATIVE
UNIT DIVISION: 0
Unit division: 0

## 2016-09-27 ENCOUNTER — Ambulatory Visit: Payer: Self-pay | Admitting: Advanced Practice Midwife

## 2016-10-10 IMAGING — US US OB FOLLOW-UP
1 series · 12 of 28 positions shown · non-contrast
Comparison: none

[Series 1: us ob follow-up · 0.21mm/px · 12 of 60 slices shown]
[im 3/60]
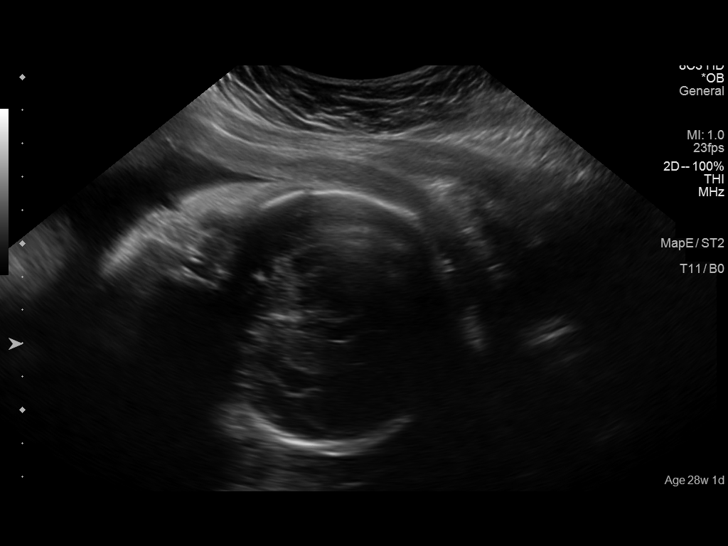
[im 7/60]
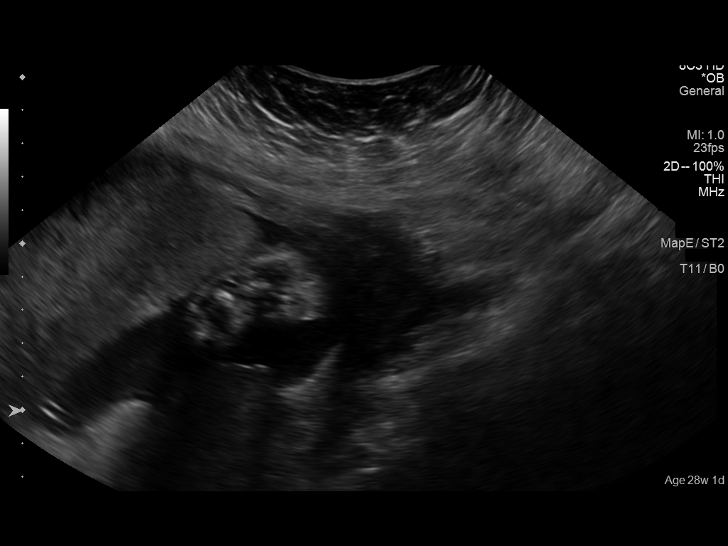
[im 11/60]
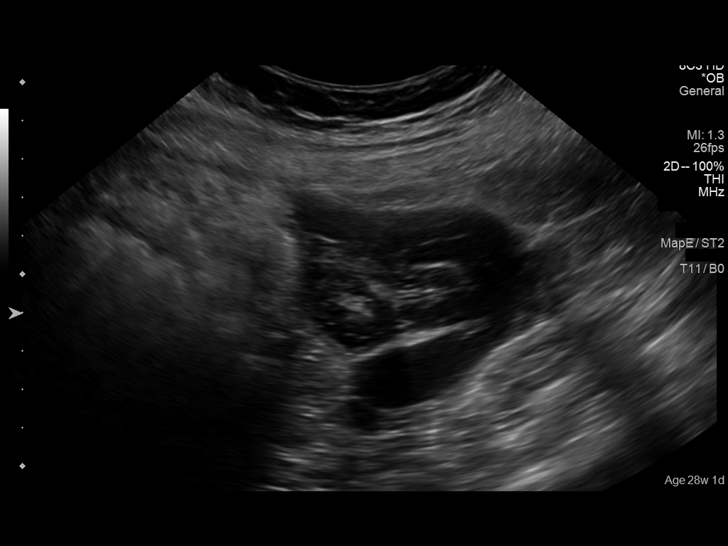
[im 18/60]
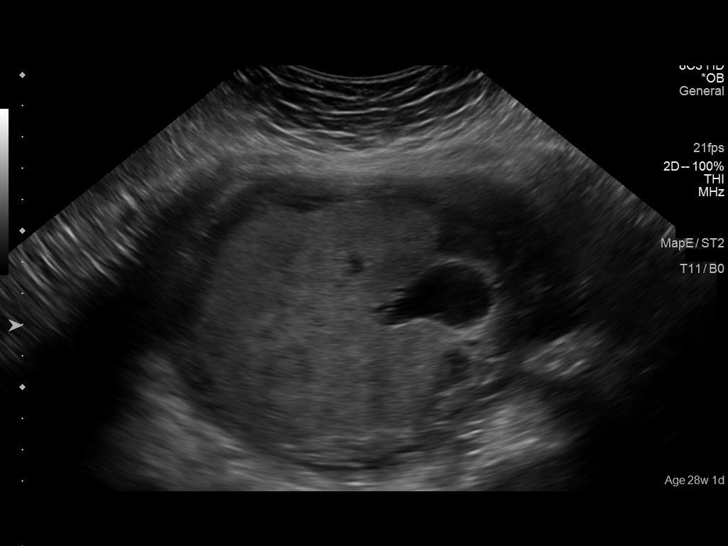
[im 22/60]
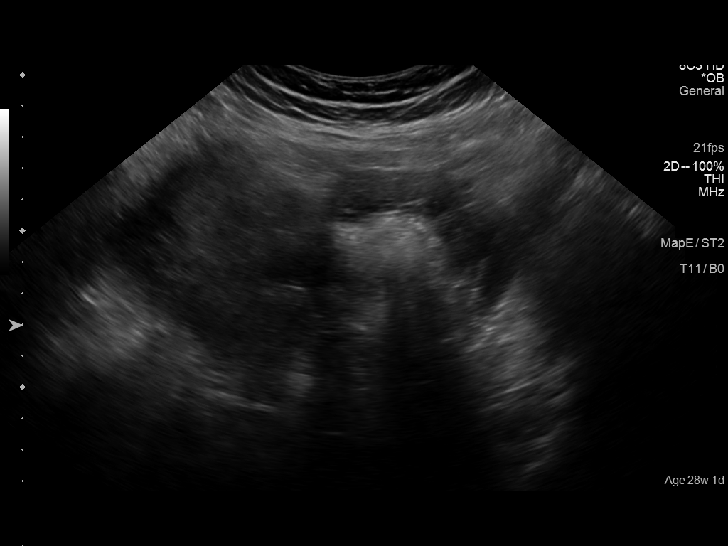
[im 27/60]
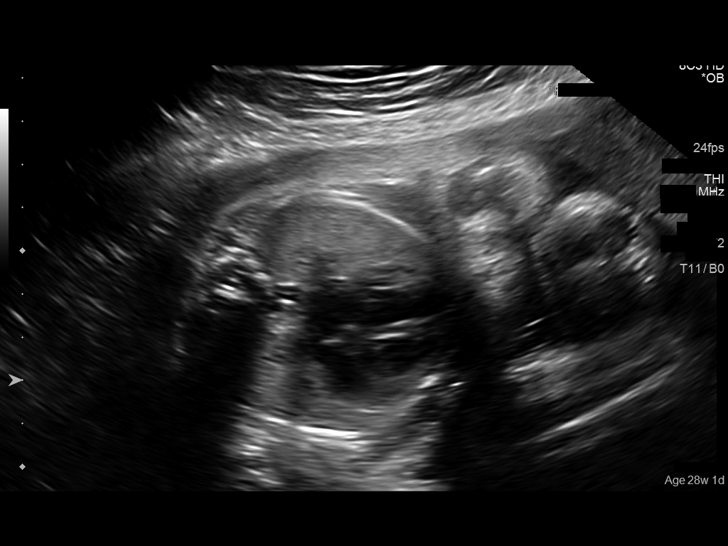
[im 33/60]
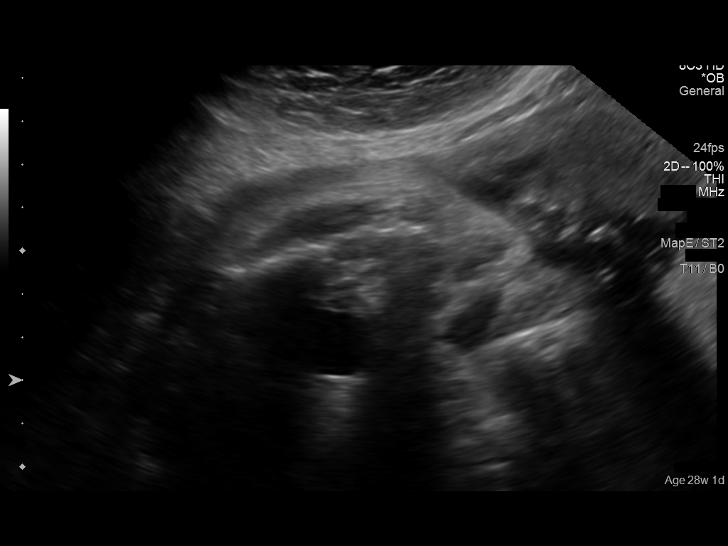
[im 38/60]
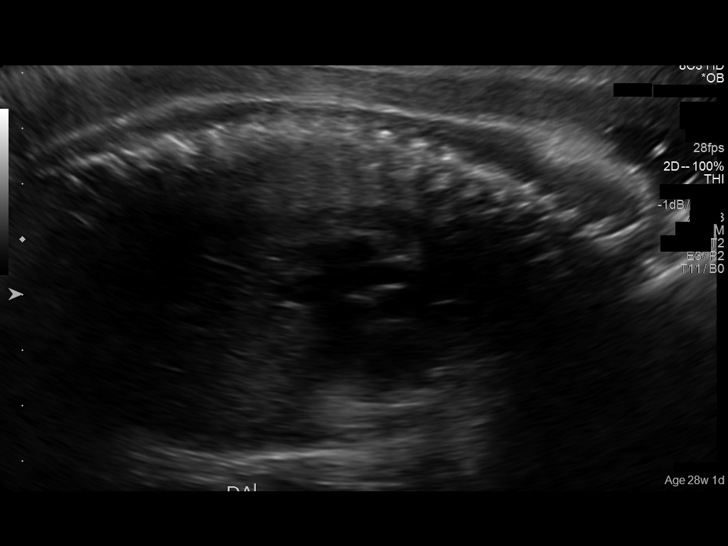
[im 42/60]
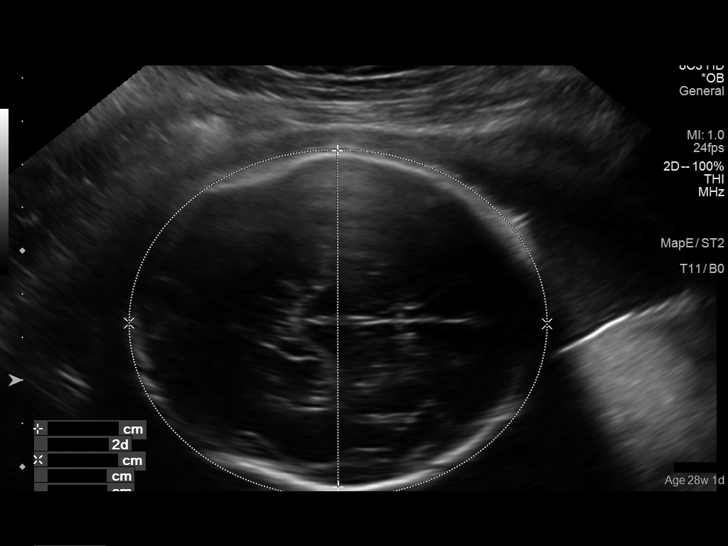
[im 49/60]
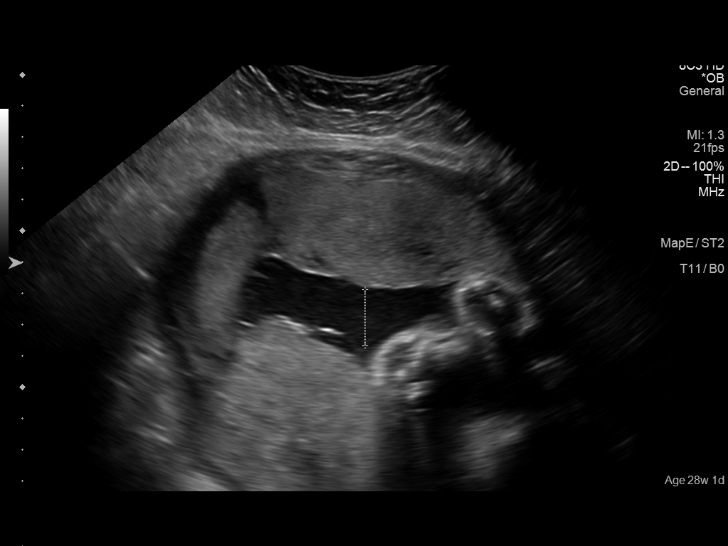
[im 53/60]
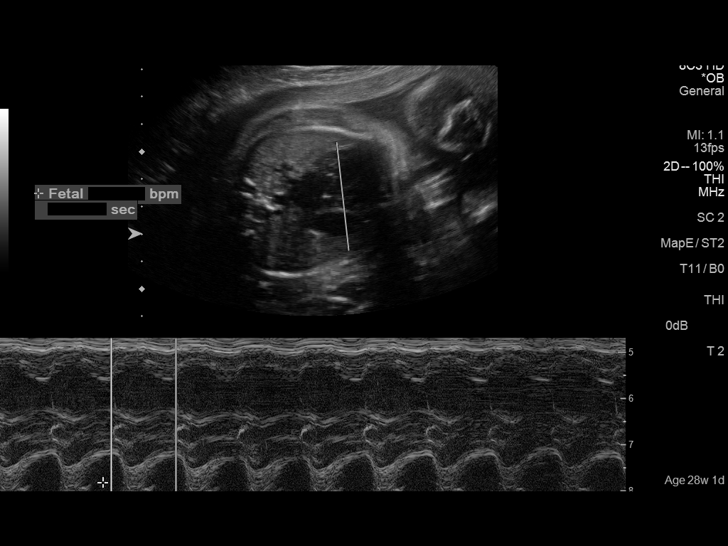
[im 57/60]
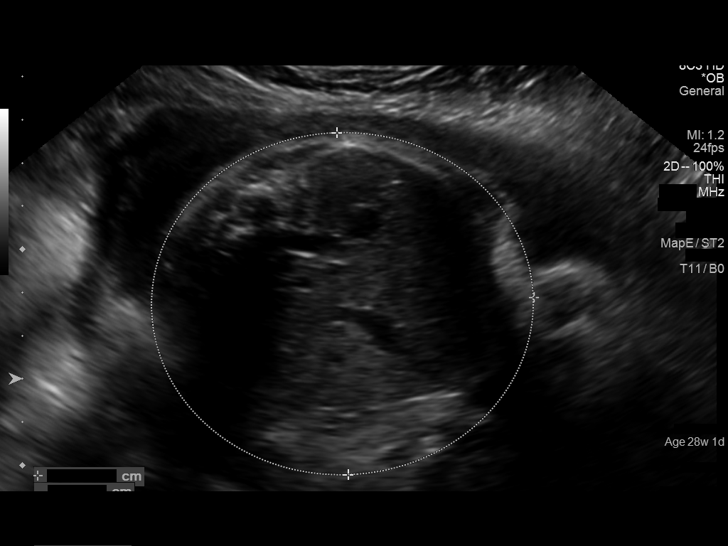

[12 of 28 positions shown; findings below may reference images not displayed]

OBSTETRICS REPORT
                      (Signed Final 01/10/2015 [DATE])

             [id number]

Service(s) Provided

 US OB FOLLOW UP                                       76816.1
Indications

 28 weeks gestation of pregnancy
Fetal Evaluation

 Num Of Fetuses:    1
 Fetal Heart Rate:  147                          bpm
 Cardiac Activity:  Observed
 Presentation:      Cephalic
 Placenta:          Left lateral, above cervical
                    os
 P. Cord            Previously Visualized
 Insertion:

 Amniotic Fluid
 AFI FV:      Subjectively within normal limits
 AFI Sum:     12.44   cm       31  %Tile      Larg Pckt:   4.51  cm
 RUQ:   1.8     cm   RLQ:    2.03   cm    LUQ:   4.51    cm    LLQ:   4.1     cm
Biometry

 BPD:     77.9  mm     G. Age:  31w 2d                CI:        77.65   70 - 86
                                                      FL/HC:       20.2  18.8 -

 HC:     279.8  mm     G. Age:  30w 5d       89   %   HC/AC:       1.10  1.05 -

 AC:     254.6  mm     G. Age:  29w 5d       83   %   FL/BPD:      72.4  71 - 87
 FL:      56.4  mm     G. Age:  29w 5d       77   %   FL/AC:       22.2  20 - 24

 Est. FW:    2966   gm     3 lb 4 oz     79  %
Gestational Age

 LMP:           28w 1d        Date:  06/27/14                 EDD:   04/03/15
 U/S Today:     30w 2d                                        EDD:   03/19/15
 Best:          28w 1d     Det. By:  LMP  (06/27/14)          EDD:   04/03/15
Anatomy
 Cranium:          Appears normal         Aortic Arch:      Appears normal
 Fetal Cavum:      Appears normal         Ductal Arch:      Not well visualized
 Ventricles:       Appears normal         Diaphragm:        Not well visualized
 Choroid Plexus:   Appears normal         Stomach:          Appears normal, left
                                                            sided
 Cerebellum:       Appears normal         Abdomen:          Appears normal
 Posterior Fossa:  Previously seen        Abdominal Wall:   Appears nml (cord
                                                            insert, abd wall)
 Nuchal Fold:      Not applicable (>20    Cord Vessels:     Appears normal (3
                   wks GA)                                  vessel cord)
 Face:             Appears normal         Kidneys:          Appear normal
                   (orbits and profile)
 Lips:             Appears normal         Bladder:          Appears normal
 Heart:            Appears normal         Spine:            Appears normal
                   (4CH, axis, and
                   situs)
 RVOT:             Previously seen        Lower             Appears normal
                                          Extremities:
 LVOT:             Appears normal         Upper             Appears normal
                                          Extremities:

 Other:  Fetus appears to be a female. Technically difficult due to fetal position.
Cervix Uterus Adnexa

 Cervical Length:    3         cm

 Cervix:       Normal appearance by transabdominal scan.
 Left Ovary:    Not visualized.
 Right Ovary:   Within normal limits.

 Adnexa:     No abnormality visualized.
 Comment:    N
Impression

 Single IUP at 28w 1d
 Gestational diabetes on insulin
 Fetal growth is appropriate (79th %tile)
 Left lateral placenta without previa
 Normal amniotic fluid volume
Recommendations

 Recommend follow up ultrasound for growth in 4 weeks
 Antenatal testing beginning at 32 weeks (2x weekly NSTs
 with weekly AFIs)

## 2017-01-07 ENCOUNTER — Other Ambulatory Visit (INDEPENDENT_AMBULATORY_CARE_PROVIDER_SITE_OTHER): Payer: Self-pay | Admitting: Pediatric Gastroenterology

## 2017-01-07 MED ORDER — PANCRELIPASE (LIP-PROT-AMYL) 3000-9500 UNITS PO CPEP: ORAL_CAPSULE | ORAL | 2 refills | Status: AC

## 2017-01-07 MED ORDER — ACIDOPHILUS LACTOBACILLUS PO CAPS: 1.0000 | ORAL_CAPSULE | Freq: Two times a day (BID) | ORAL | 2 refills | Status: AC

## 2018-02-17 ENCOUNTER — Encounter (INDEPENDENT_AMBULATORY_CARE_PROVIDER_SITE_OTHER): Payer: Self-pay | Admitting: Pediatric Gastroenterology
